# Patient Record
Sex: Female | Born: 1990 | Race: White | Hispanic: No | Marital: Married | Smoking: Never smoker
Health system: Southern US, Community
[De-identification: ages and names within clinical notes are randomized; demographics above are authoritative.]

## PROBLEM LIST (undated history)

## (undated) DIAGNOSIS — R87629 Unspecified abnormal cytological findings in specimens from vagina: Secondary | ICD-10-CM

## (undated) DIAGNOSIS — Z1589 Genetic susceptibility to other disease: Secondary | ICD-10-CM

## (undated) DIAGNOSIS — G2581 Restless legs syndrome: Secondary | ICD-10-CM

## (undated) DIAGNOSIS — O24419 Gestational diabetes mellitus in pregnancy, unspecified control: Secondary | ICD-10-CM

## (undated) DIAGNOSIS — E039 Hypothyroidism, unspecified: Secondary | ICD-10-CM

## (undated) DIAGNOSIS — O139 Gestational [pregnancy-induced] hypertension without significant proteinuria, unspecified trimester: Secondary | ICD-10-CM

## (undated) DIAGNOSIS — E7212 Methylenetetrahydrofolate reductase deficiency: Secondary | ICD-10-CM

## (undated) HISTORY — DX: Gestational diabetes mellitus in pregnancy, unspecified control: O24.419

## (undated) HISTORY — PX: APPENDECTOMY: SHX54

## (undated) HISTORY — DX: Methylenetetrahydrofolate reductase deficiency: E72.12

## (undated) HISTORY — DX: Unspecified abnormal cytological findings in specimens from vagina: R87.629

## (undated) HISTORY — DX: Gestational (pregnancy-induced) hypertension without significant proteinuria, unspecified trimester: O13.9

## (undated) HISTORY — DX: Hypothyroidism, unspecified: E03.9

## (undated) HISTORY — PX: WISDOM TOOTH EXTRACTION: SHX21

## (undated) HISTORY — DX: Genetic susceptibility to other disease: Z15.89

## (undated) HISTORY — DX: Restless legs syndrome: G25.81

---

## 2011-07-17 ENCOUNTER — Inpatient Hospital Stay (INDEPENDENT_AMBULATORY_CARE_PROVIDER_SITE_OTHER)
Admission: RE | Admit: 2011-07-17 | Discharge: 2011-07-17 | Disposition: A | Discharge status: Home / Self Care | Payer: 59 | Source: Ambulatory Visit | Attending: Family Medicine | Admitting: Family Medicine

## 2011-07-17 ENCOUNTER — Ambulatory Visit (INDEPENDENT_AMBULATORY_CARE_PROVIDER_SITE_OTHER): Payer: 59

## 2011-07-17 DIAGNOSIS — J45909 Unspecified asthma, uncomplicated: Secondary | ICD-10-CM

## 2011-09-29 ENCOUNTER — Emergency Department (HOSPITAL_COMMUNITY)
Admission: EM | Admit: 2011-09-29 | Discharge: 2011-09-29 | Disposition: A | Discharge status: Home / Self Care | Payer: 59 | Attending: Emergency Medicine | Admitting: Emergency Medicine

## 2011-09-29 DIAGNOSIS — R000 Tachycardia, unspecified: Secondary | ICD-10-CM | POA: Insufficient documentation

## 2011-09-29 DIAGNOSIS — T50904A Poisoning by unspecified drugs, medicaments and biological substances, undetermined, initial encounter: Secondary | ICD-10-CM | POA: Insufficient documentation

## 2011-09-29 DIAGNOSIS — T50901A Poisoning by unspecified drugs, medicaments and biological substances, accidental (unintentional), initial encounter: Secondary | ICD-10-CM | POA: Insufficient documentation

## 2011-09-29 LAB — ACETAMINOPHEN LEVEL: Acetaminophen (Tylenol), Serum: <15 ug/mL (ref 10–30)

## 2011-09-29 LAB — URINALYSIS, ROUTINE W REFLEX MICROSCOPIC
Bilirubin Urine: NEGATIVE
Hgb urine dipstick: NEGATIVE
Ketones, ur: NEGATIVE mg/dL
Nitrite: NEGATIVE
Protein, ur: NEGATIVE mg/dL
Urobilinogen, UA: 0.2 mg/dL (ref 0.0–1.0)

## 2011-09-29 LAB — DIFFERENTIAL
Basophils Absolute: 0 10*3/uL (ref 0.0–0.1)
Eosinophils Relative: 7 % — ABNORMAL HIGH (ref 0–5)
Lymphocytes Relative: 33 % (ref 12–46)
Neutro Abs: 3.8 10*3/uL (ref 1.7–7.7)

## 2011-09-29 LAB — COMPREHENSIVE METABOLIC PANEL
ALT: 18 U/L (ref 0–35)
AST: 17 U/L (ref 0–37)
Albumin: 4.4 g/dL (ref 3.5–5.2)
CO2: 24 mEq/L (ref 19–32)
Calcium: 10.2 mg/dL (ref 8.4–10.5)
GFR calc non Af Amer: >90 mL/min (ref >90)
Sodium: 138 mEq/L (ref 135–145)
Total Protein: 8.2 g/dL (ref 6.0–8.3)

## 2011-09-29 LAB — RAPID URINE DRUG SCREEN, HOSP PERFORMED
Amphetamines: NOT DETECTED
Barbiturates: NOT DETECTED
Benzodiazepines: NOT DETECTED
Cocaine: NOT DETECTED
Tetrahydrocannabinol: NOT DETECTED

## 2011-09-29 LAB — CBC
HCT: 39.9 % (ref 36.0–46.0)
RDW: 12.7 % (ref 11.5–15.5)
WBC: 7.2 10*3/uL (ref 4.0–10.5)

## 2011-09-29 LAB — POCT PREGNANCY, URINE: Preg Test, Ur: NEGATIVE

## 2011-11-01 ENCOUNTER — Encounter: Payer: Self-pay | Admitting: Emergency Medicine

## 2011-11-01 ENCOUNTER — Emergency Department (HOSPITAL_COMMUNITY)
Admission: EM | Admit: 2011-11-01 | Discharge: 2011-11-01 | Disposition: A | Discharge status: Home / Self Care | Payer: Self-pay | Source: Home / Self Care | Attending: Family Medicine | Admitting: Family Medicine

## 2011-11-01 DIAGNOSIS — J069 Acute upper respiratory infection, unspecified: Secondary | ICD-10-CM

## 2011-11-01 NOTE — ED Notes (Signed)
Sorethroat, lethargy, rash, no nausea, no vomiting, poor appetite

## 2011-11-06 NOTE — ED Provider Notes (Signed)
History     CSN: [id number] Arrival date & time: 11/01/2011  7:07 PM   First MD Initiated Contact with Patient 11/01/11 1842      Chief Complaint  Patient presents with  . Sore Throat    (Consider location/radiation/quality/duration/timing/severity/associated sxs/prior treatment) Patient is a 20 y.o. female presenting with URI. The history is provided by the patient.  URI The primary symptoms include fever, fatigue, sore throat, cough, myalgias and rash. The current episode started 3 to 5 days ago. This is a new problem.  Symptoms associated with the illness include congestion and rhinorrhea.    Past Medical History  Diagnosis Date  . Asthma     Past Surgical History  Procedure Date  . Appendectomy     History reviewed. No pertinent family history.  History  Substance Use Topics  . Smoking status: Never Smoker   . Smokeless tobacco: Not on file  . Alcohol Use: No    OB History    Grav Para Term Preterm Abortions TAB SAB Ect Mult Living                  Review of Systems  Constitutional: Positive for fever, appetite change and fatigue.  HENT: Positive for congestion, sore throat and rhinorrhea.   Eyes: Negative.   Respiratory: Positive for cough.   Cardiovascular: Negative.   Gastrointestinal: Negative.   Genitourinary: Negative.   Musculoskeletal: Positive for myalgias.  Skin: Positive for rash.    Allergies  Sulfa antibiotics  Home Medications   Current Outpatient Rx  Name Route Sig Dispense Refill  . DIPHENHYDRAMINE HCL 25 MG PO TABS Oral Take 25 mg by mouth every 6 (six) hours as needed.      . IBUPROFEN 200 MG PO CAPS Oral Take by mouth.        BP 132/78  Pulse 115  Temp(Src) 98.9 F (37.2 C) (Oral)  Resp 19  SpO2 95%  LMP 10/25/2011  Physical Exam  Constitutional: She is oriented to person, place, and time. She appears well-developed and well-nourished.  HENT:  Head: Normocephalic and atraumatic.  Right Ear: Tympanic membrane and  external ear normal.  Left Ear: Tympanic membrane and external ear normal.  Nose: Nose normal.  Mouth/Throat: Uvula is midline, oropharynx is clear and moist and mucous membranes are normal.  Eyes: EOM are normal. Pupils are equal, round, and reactive to light.  Neck: Normal range of motion.  Cardiovascular: Normal rate and regular rhythm.   Pulmonary/Chest: Effort normal and breath sounds normal.  Neurological: She is alert and oriented to person, place, and time.  Skin: Skin is warm and dry.    ED Course  Procedures (including critical care time)  Labs Reviewed - No data to display No results found.   1. URI (upper respiratory infection)       MDM          Richardo Priest, MD 11/06/11 2236

## 2012-03-02 ENCOUNTER — Other Ambulatory Visit (HOSPITAL_COMMUNITY)
Admission: RE | Admit: 2012-03-02 | Discharge: 2012-03-02 | Disposition: A | Discharge status: Home / Self Care | Payer: 59 | Source: Ambulatory Visit | Attending: Obstetrics and Gynecology | Admitting: Obstetrics and Gynecology

## 2012-03-02 DIAGNOSIS — Z01419 Encounter for gynecological examination (general) (routine) without abnormal findings: Secondary | ICD-10-CM | POA: Insufficient documentation

## 2012-10-10 IMAGING — CR DG CHEST 2V
2 series · 2 of 2 positions shown · non-contrast
Comparison: None.

CLINICAL DATA: Cough.

CHEST - 2 VIEW

[view not recorded (1 of 2)]
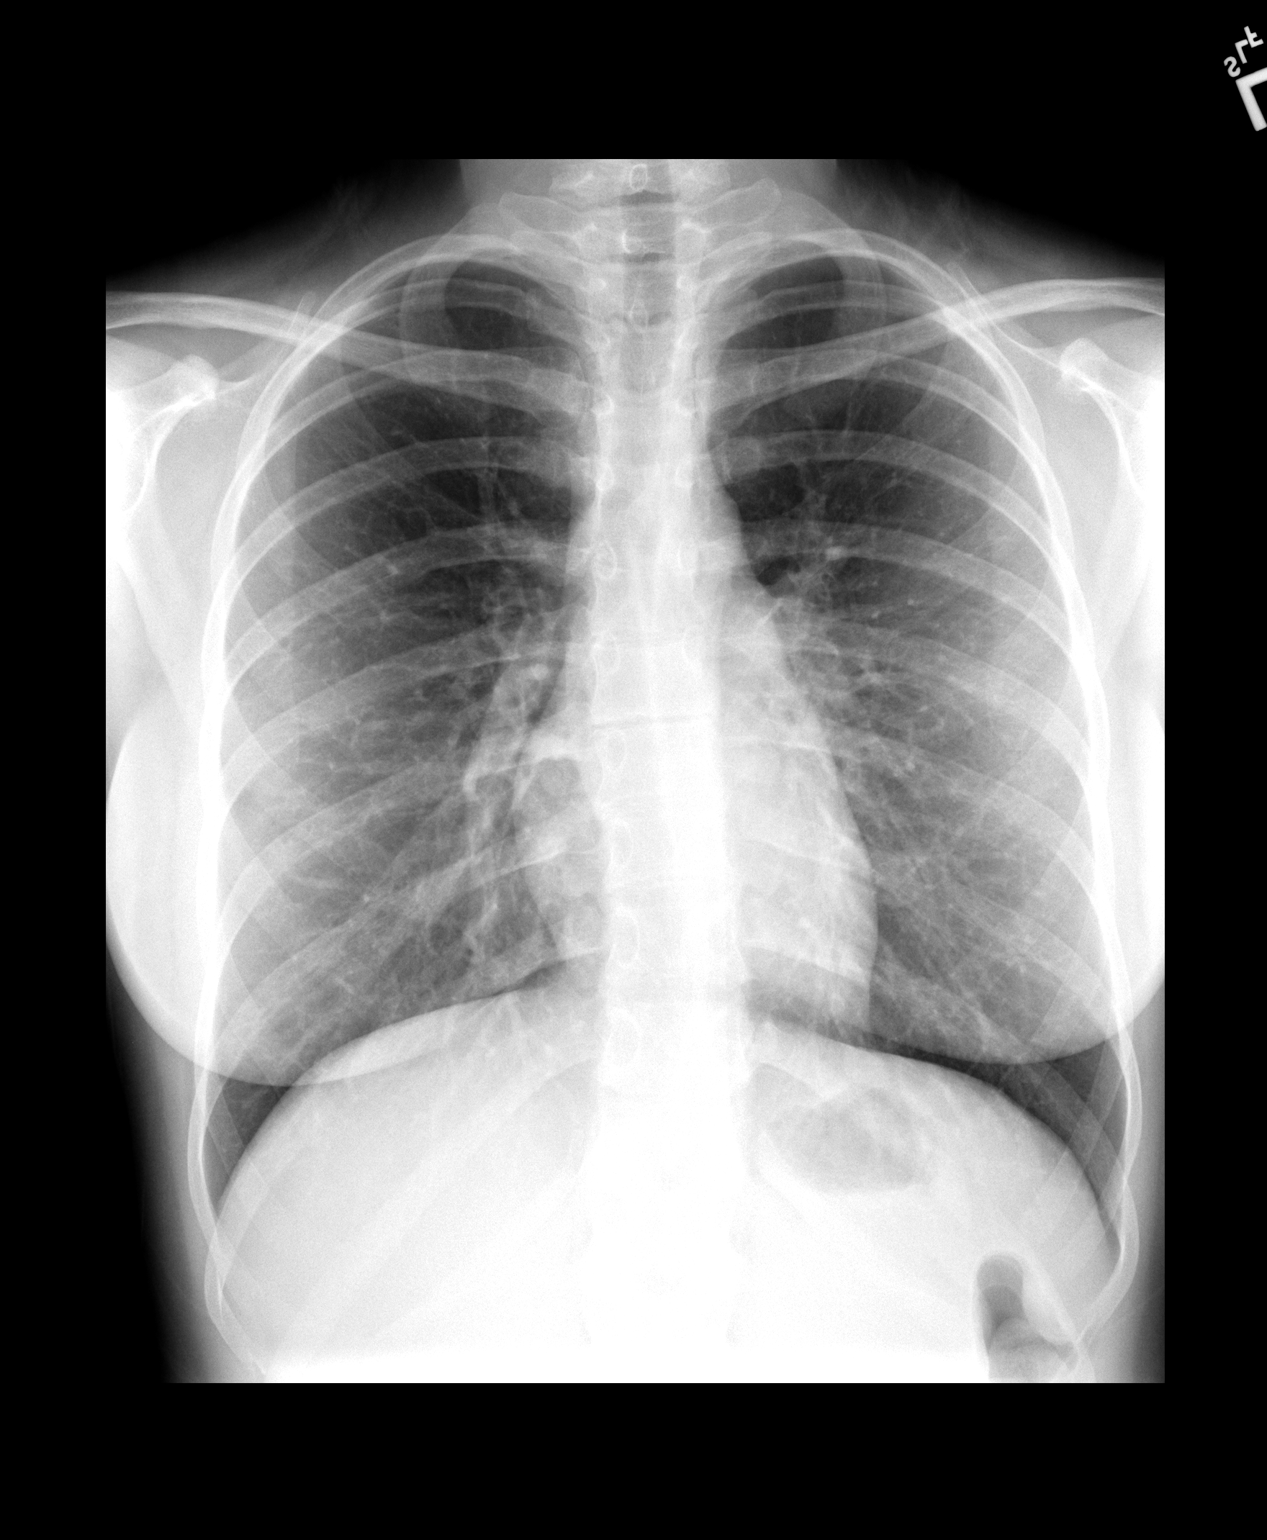

[view not recorded (2 of 2)]
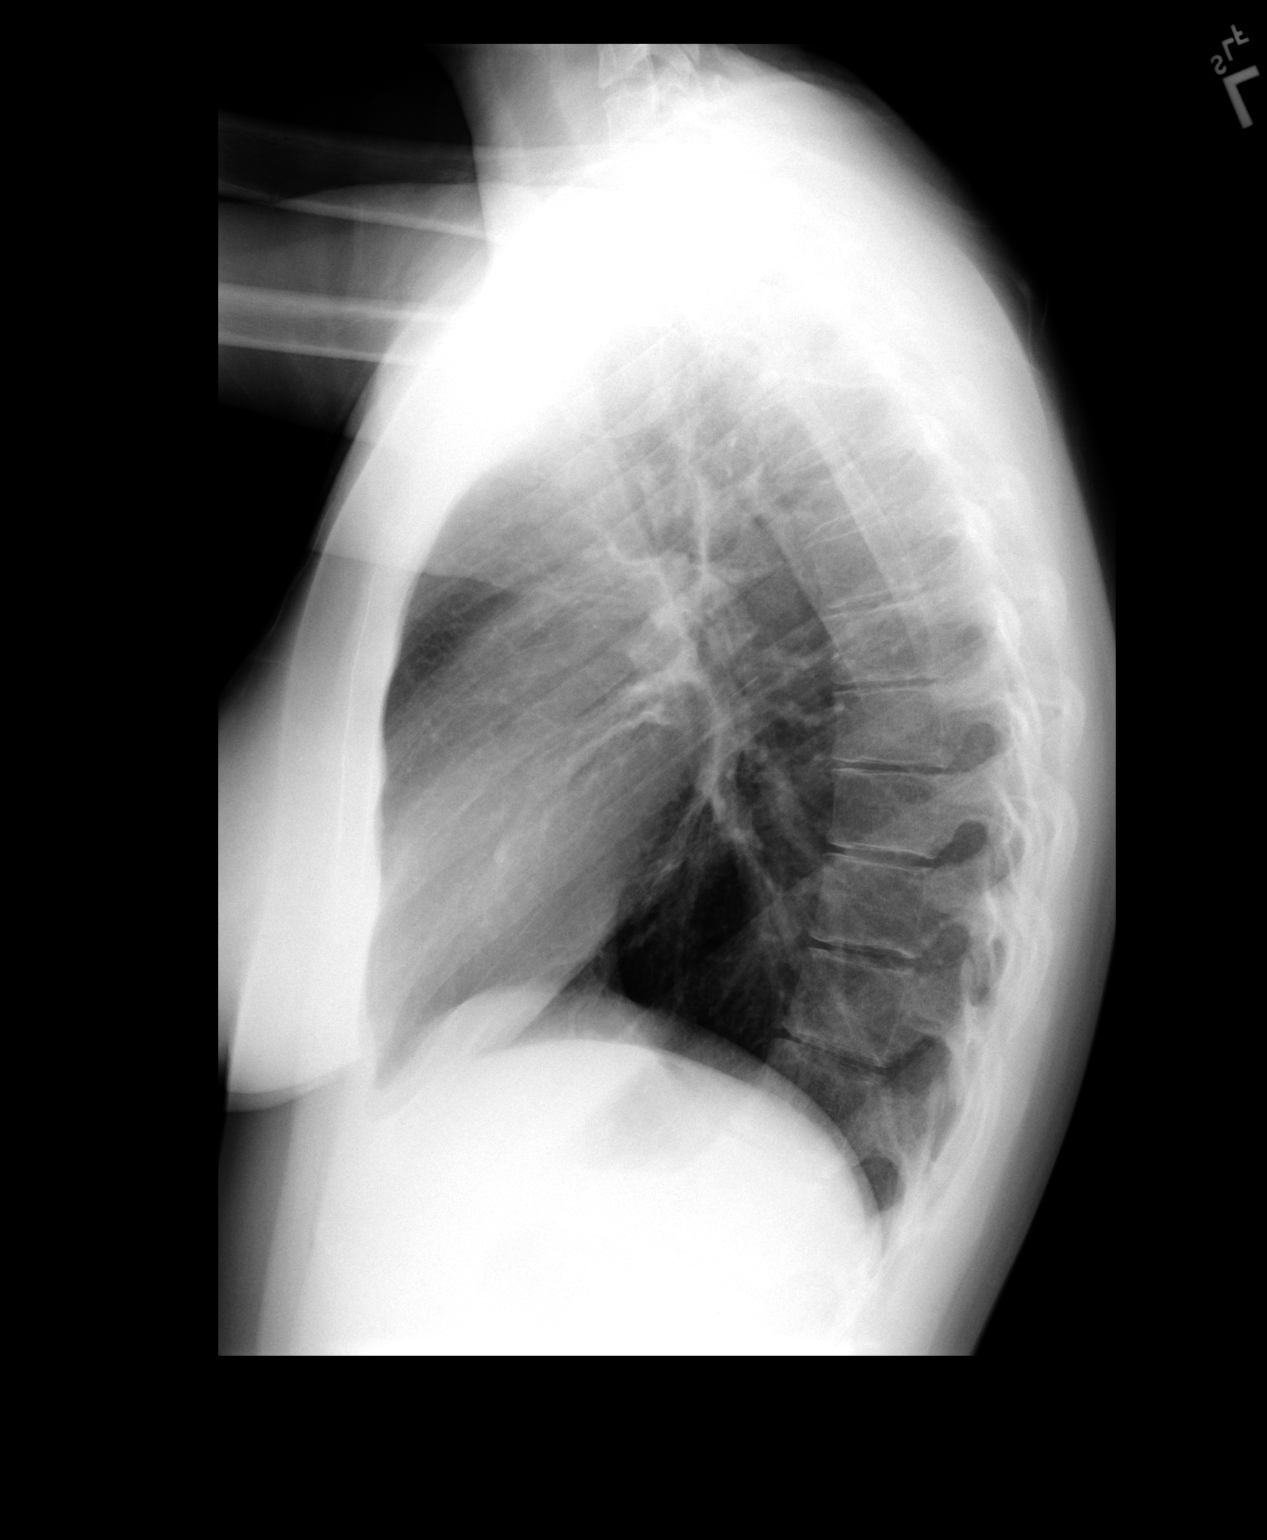

[2 of 2 positions shown; findings below may reference images not displayed]

FINDINGS: Heart and mediastinal contours are within normal limits.
No focal opacities or effusions.  No acute bony abnormality.
IMPRESSION: No active cardiopulmonary disease.

## 2014-03-02 ENCOUNTER — Other Ambulatory Visit (HOSPITAL_COMMUNITY)
Admission: RE | Admit: 2014-03-02 | Discharge: 2014-03-02 | Disposition: A | Discharge status: Home / Self Care | Payer: BC Managed Care – PPO | Source: Ambulatory Visit | Attending: Obstetrics and Gynecology | Admitting: Obstetrics and Gynecology

## 2014-03-02 ENCOUNTER — Other Ambulatory Visit: Payer: Self-pay | Admitting: Nurse Practitioner

## 2014-03-02 DIAGNOSIS — Z01419 Encounter for gynecological examination (general) (routine) without abnormal findings: Secondary | ICD-10-CM | POA: Insufficient documentation

## 2016-09-29 ENCOUNTER — Other Ambulatory Visit: Payer: Self-pay | Admitting: Adult Health

## 2016-10-01 ENCOUNTER — Encounter: Payer: Self-pay | Admitting: Adult Health

## 2016-10-01 ENCOUNTER — Other Ambulatory Visit (HOSPITAL_COMMUNITY)
Admission: RE | Admit: 2016-10-01 | Discharge: 2016-10-01 | Disposition: A | Discharge status: Home / Self Care | Payer: BC Managed Care – PPO | Source: Ambulatory Visit | Attending: Obstetrics & Gynecology | Admitting: Obstetrics & Gynecology

## 2016-10-01 ENCOUNTER — Ambulatory Visit (INDEPENDENT_AMBULATORY_CARE_PROVIDER_SITE_OTHER): Payer: BC Managed Care – PPO | Admitting: Adult Health

## 2016-10-01 VITALS — BP 116/60 | HR 90 | Ht 64.25 in | Wt 116.5 lb

## 2016-10-01 DIAGNOSIS — N898 Other specified noninflammatory disorders of vagina: Secondary | ICD-10-CM

## 2016-10-01 DIAGNOSIS — L298 Other pruritus: Secondary | ICD-10-CM

## 2016-10-01 DIAGNOSIS — Z01419 Encounter for gynecological examination (general) (routine) without abnormal findings: Secondary | ICD-10-CM | POA: Insufficient documentation

## 2016-10-01 LAB — POCT WET PREP (WET MOUNT)
Clue Cells Wet Prep Whiff POC: NEGATIVE
Trichomonas Wet Prep HPF POC: ABSENT
WBC WET PREP: NEGATIVE

## 2016-10-01 NOTE — Progress Notes (Signed)
Patient ID: Anna HandlerBethany Guzman, female   DOB: 09/26/1991, 25 y.o.   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 PCP is [facility].   Current Medications, Allergies, Past Medical History, Past Surgical History, Family History and Social History were reviewed in [name] electronic medical record.     Review of Systems: Patient denies any headaches, hearing loss, fatigue, blurred vision, shortness of breath, chest pain, abdominal pain, problems with bowel movements, urination, or intercourse. No joint pain or mood swings.    Physical Exam:BP 116/60 (BP Location: Left Arm, Patient Position: Sitting, Cuff Size: Normal)   Pulse 90   Ht 5' 4.25" (1.632 m)   Wt 116 lb 8 oz (52.8 kg)   LMP 09/08/2016 (Exact Date)   BMI 19.84 kg/m  General:  Well developed, well nourished, no acute distress Skin:  Warm and dry Neck:  Midline trachea, normal thyroid, good ROM, no lymphadenopathy Lungs; Clear to auscultation bilaterally Breast:  No dominant palpable mass, retraction, or nipple discharge Cardiovascular: Regular rate and rhythm Abdomen:  Soft, non tender, no hepatosplenomegaly Pelvic:  External genitalia is normal in appearance, no lesions.  The vagina is normal in appearance. Urethra has no lesions or masses. The cervix is nulliparous, pap performed.  Uterus is felt to be normal size, shape, and contour.  No adnexal masses or tenderness noted.Bladder is non tender, no masses felt.Wet prep was negative. Extremities/musculoskeletal:  No swelling or varicosities noted, no clubbing or cyanosis Psych:  No mood changes, alert and cooperative,seems happy PHQ 2 score 0.Discussed Taytulla and gave her handout from up to date to read and let me know, if she wants to try  it.  Impression:  1. Encounter for gynecological examination with Papanicolaou smear of cervix   2. Vaginal itching      Plan:  Physical in 1 year,pap in 3 if normal  [facility]

## 2016-10-01 NOTE — Patient Instructions (Signed)
Physical in 1 year,pap in 3 if normal 

## 2016-10-03 LAB — CYTOLOGY - PAP: DIAGNOSIS: NEGATIVE

## 2016-12-11 ENCOUNTER — Other Ambulatory Visit: Payer: Self-pay | Admitting: Adult Health

## 2016-12-15 ENCOUNTER — Telehealth: Payer: Self-pay | Admitting: Obstetrics & Gynecology

## 2016-12-15 ENCOUNTER — Telehealth: Payer: Self-pay | Admitting: Adult Health

## 2016-12-15 NOTE — Telephone Encounter (Signed)
Spoke with pt. Pt was wondering since she changed patch on Monday at 5 pm would she be on a schedule of continuing to change on Monday. I advised yes, to continue changing on Monday's. Pt voiced understanding. JSY

## 2016-12-15 NOTE — Telephone Encounter (Signed)
Pt called stating that she will need a refill of her medication, pt states that the pharmacy sent in a request on last week Thursday and was wanting to know the status. Please contact pt

## 2016-12-15 NOTE — Telephone Encounter (Signed)
Spoke with pt letting her know patch was refilled today. Pt was supposed to have put a new patch on yesterday am but didn't have any. I spoke with Selena BattenKim, CNM and she advised to put new patch on today and use back up for 2 weeks. Pt voiced understanding. JSY

## 2016-12-22 ENCOUNTER — Telehealth: Payer: Self-pay | Admitting: Adult Health

## 2016-12-22 NOTE — Telephone Encounter (Signed)
Spoke with pt. Pt wants to change birth control patch on Monday am instead of Monday at 5 pm. I advised that would be fine. Can use back up for 2 weeks just to be safe since changing the time of birth control. Pt voiced understanding. JSY

## 2017-03-17 ENCOUNTER — Encounter: Payer: Self-pay | Admitting: Adult Health

## 2017-05-25 ENCOUNTER — Other Ambulatory Visit: Payer: Self-pay | Admitting: Adult Health

## 2017-10-22 ENCOUNTER — Other Ambulatory Visit: Payer: Self-pay | Admitting: Adult Health

## 2017-11-18 ENCOUNTER — Other Ambulatory Visit: Payer: Self-pay | Admitting: Adult Health

## 2017-12-25 ENCOUNTER — Other Ambulatory Visit: Payer: BC Managed Care – PPO | Admitting: Adult Health

## 2017-12-28 ENCOUNTER — Ambulatory Visit (INDEPENDENT_AMBULATORY_CARE_PROVIDER_SITE_OTHER): Payer: BC Managed Care – PPO | Admitting: Adult Health

## 2017-12-28 ENCOUNTER — Encounter: Payer: Self-pay | Admitting: Adult Health

## 2017-12-28 VITALS — BP 126/76 | HR 89 | Resp 18 | Ht 64.25 in | Wt 119.0 lb

## 2017-12-28 DIAGNOSIS — Z3045 Encounter for surveillance of transdermal patch hormonal contraceptive device: Secondary | ICD-10-CM | POA: Insufficient documentation

## 2017-12-28 DIAGNOSIS — Z01419 Encounter for gynecological examination (general) (routine) without abnormal findings: Secondary | ICD-10-CM | POA: Diagnosis not present

## 2017-12-28 MED ORDER — NORELGESTROMIN-ETH ESTRADIOL 150-35 MCG/24HR TD PTWK: MEDICATED_PATCH | TRANSDERMAL | 4 refills | Status: DC

## 2017-12-28 NOTE — Progress Notes (Signed)
Patient ID: Anna HandlerBethany Guzman, female   DOB: 10/17/1991, 27 y.o.   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    Current Medications, Allergies, Past Medical History, Past Surgical History, Family History and Social History were reviewed in [name] electronic medical record.     Review of Systems: Patient denies any headaches, hearing loss, fatigue, blurred vision, shortness of breath, chest pain, abdominal pain, problems with bowel movements, urination, or intercourse. No joint pain or mood swings.   Physical Exam:BP 126/76 (BP Location: Left Arm, Patient Position: Sitting, Cuff Size: Normal)   Pulse 89   Resp 18   Ht 5' 4.25" (1.632 m)   Wt 119 lb (54 kg)   LMP 12/14/2017 (Exact Date)   BMI 20.27 kg/m  General:  Well developed, well nourished, no acute distress Skin:  Warm and dry Neck:  Midline trachea, normal thyroid, good ROM, no lymphadenopathy Lungs; Clear to auscultation bilaterally Breast:  No dominant palpable mass, retraction, or nipple discharge Cardiovascular: Regular rate and rhythm Abdomen:  Soft, non tender, no hepatosplenomegaly Pelvic:  External genitalia is normal in appearance, no lesions.  The vagina is normal in appearance,with brown blood. Urethra has no lesions or masses. The cervix is smooth.  Uterus is felt to be normal size, shape, and contour.  No adnexal masses or tenderness noted.Bladder is non tender, no masses felt. Extremities/musculoskeletal:  No swelling or varicosities noted, no clubbing or cyanosis Psych:  No mood changes, alert and cooperative,seems happy PHQ 2 score 0.She is thinking of trying to get pregnant sometime after the Fall.   Impression: 1. Encounter for well woman exam with routine gynecological exam   2. Encounter for surveillance of transdermal patch hormonal contraceptive  device       Plan:  Meds ordered this encounter  Medications  . norelgestromin-ethinyl estradiol (XULANE) 150-35 MCG/24HR transdermal patch    Sig: APPLY 1 PATCH PER WEEK FOR THREE WEEKS THEN OFF ONE WEEK AS DIRECTED    Dispense:  9 patch    Refill:  4    Order Specific Question:   Supervising Provider    Answer:   Lazaro ArmsEURE, LUTHER H [[id number]]  Physical and pap in 1 year Call if wants PNV called in,can use OTC with 800 mcg folic acid  Review handout on preparing for pregnancy

## 2017-12-28 NOTE — Patient Instructions (Signed)
Preparing for Pregnancy If you are considering becoming pregnant, make an appointment to see your regular health care provider to learn how to prepare for a safe and healthy pregnancy (preconception care). During a preconception care visit, your health care provider will:  Do a complete physical exam, including a Pap test.  Take a complete medical history.  Give you information, answer your questions, and help you resolve problems.  Preconception checklist Medical history  Tell your health care provider about any current or past medical conditions. Your pregnancy or your ability to become pregnant may be affected by chronic conditions, such as diabetes, chronic hypertension, and thyroid problems.  Include your family's medical history as well as your partner's medical history.  Tell your health care provider about any history of STIs (sexually transmitted infections).These can affect your pregnancy. In some cases, they can be passed to your baby. Discuss any concerns that you have about STIs.  If indicated, discuss the benefits of genetic testing. This testing will show whether there are any genetic conditions that may be passed from you or your partner to your baby.  Tell your health care provider about: ? Any problems you have had with conception or pregnancy. ? Any medicines you take. These include vitamins, herbal supplements, and over-the-counter medicines. ? Your history of immunizations. Discuss any vaccinations that you may need.  Diet  Ask your health care provider what to include in a healthy diet that has a balance of nutrients. This is especially important when you are pregnant or preparing to become pregnant.  Ask your health care provider to help you reach a healthy weight before pregnancy. ? If you are overweight, you may be at higher risk for certain complications, such as high blood pressure, diabetes, and preterm birth. ? If you are underweight, you are more likely  to have a baby who has a low birth weight.  Lifestyle, work, and home  Let your health care provider know: ? About any lifestyle habits that you have, such as alcohol use, drug use, or smoking. ? About recreational activities that may put you at risk during pregnancy, such as downhill skiing and certain exercise programs. ? Tell your health care provider about any international travel, especially any travel to places with an active Zika virus outbreak. ? About harmful substances that you may be exposed to at work or at home. These include chemicals, pesticides, radiation, or even litter boxes. ? If you do not feel safe at home.  Mental health  Tell your health care provider about: ? Any history of mental health conditions, including feelings of depression, sadness, or anxiety. ? Any medicines that you take for a mental health condition. These include herbs and supplements.  Home instructions to prepare for pregnancy Lifestyle  Eat a balanced diet. This includes fresh fruits and vegetables, whole grains, lean meats, low-fat dairy products, healthy fats, and foods that are high in fiber. Ask to meet with a nutritionist or registered dietitian for assistance with meal planning and goals.  Get regular exercise. Try to be active for at least 30 minutes a day on most days of the week. Ask your health care provider which activities are safe during pregnancy.  Do not use any products that contain nicotine or tobacco, such as cigarettes and e-cigarettes. If you need help quitting, ask your health care provider.  Do not drink alcohol.  Do not take illegal drugs.  Maintain a healthy weight. Ask your health care provider what weight range is   right for you.  General instructions  Keep an accurate record of your menstrual periods. This makes it easier for your health care provider to determine your baby's due date.  Begin taking prenatal vitamins and folic acid supplements daily as directed by  your health care provider.  Manage any chronic conditions, such as high blood pressure and diabetes, as told by your health care provider. This is important.  How do I know that I am pregnant? You may be pregnant if you have been sexually active and you miss your period. Symptoms of early pregnancy include:  Mild cramping.  Very light vaginal bleeding (spotting).  Feeling unusually tired.  Nausea and vomiting (morning sickness).  If you have any of these symptoms and you suspect that you might be pregnant, you can take a home pregnancy test. These tests check for a hormone in your urine (human chorionic gonadotropin, or hCG). A woman's body begins to make this hormone during early pregnancy. These tests are very accurate. Wait until at least the first day after you miss your period to take one. If the test shows that you are pregnant (you get a positive result), call your health care provider to make an appointment for prenatal care. What should I do if I become pregnant?  Make an appointment with your health care provider as soon as you suspect you are pregnant.  Do not use any products that contain nicotine, such as cigarettes, chewing tobacco, and e-cigarettes. If you need help quitting, ask your health care provider.  Do not drink alcoholic beverages. Alcohol is related to a number of birth defects.  Avoid toxic odors and chemicals.  You may continue to have sexual intercourse if it does not cause pain or other problems, such as vaginal bleeding. This information is not intended to replace advice given to you by your health care provider. Make sure you discuss any questions you have with your health care provider. Document Released: 11/06/2008 Document Revised: 07/22/2016 Document Reviewed: 06/15/2016 Elsevier Interactive Patient Education  2018 Elsevier Inc.  

## 2018-01-21 ENCOUNTER — Other Ambulatory Visit: Payer: Self-pay | Admitting: Adult Health

## 2018-03-02 ENCOUNTER — Encounter: Payer: Self-pay | Admitting: Adult Health

## 2018-03-25 ENCOUNTER — Encounter: Payer: Self-pay | Admitting: Adult Health

## 2018-03-26 ENCOUNTER — Encounter: Payer: Self-pay | Admitting: Adult Health

## 2018-03-29 ENCOUNTER — Other Ambulatory Visit: Payer: Self-pay | Admitting: Adult Health

## 2018-03-29 MED ORDER — PRENATAL PLUS 27-1 MG PO TABS: 1.0000 | ORAL_TABLET | Freq: Every day | ORAL | 12 refills | Status: DC

## 2018-03-29 NOTE — Progress Notes (Signed)
Rx PNV ?

## 2018-03-31 ENCOUNTER — Telehealth: Payer: Self-pay | Admitting: Obstetrics & Gynecology

## 2018-03-31 NOTE — Telephone Encounter (Signed)
Pt called asking if she needed to have blood work done before her appt on Friday. Advised pt that she did not; Advised that Dr Despina HiddenEure would order any blood work that he thought was necessary at her visit. Pt verbalized understanding.

## 2018-04-02 ENCOUNTER — Encounter: Payer: Self-pay | Admitting: Obstetrics & Gynecology

## 2018-04-02 ENCOUNTER — Ambulatory Visit: Payer: BC Managed Care – PPO | Admitting: Obstetrics & Gynecology

## 2018-04-02 VITALS — BP 140/76 | HR 102 | Ht 65.0 in | Wt 124.5 lb

## 2018-04-02 DIAGNOSIS — Z8349 Family history of other endocrine, nutritional and metabolic diseases: Secondary | ICD-10-CM

## 2018-04-02 NOTE — Progress Notes (Signed)
Chief Complaint  Patient presents with  . discuss MTHFR      27 y.o. G0P0000 Patient's last menstrual period was 03/31/2018. The current method of family planning is none.  Outpatient Encounter Medications as of 04/02/2018  Medication Sig  . ALBUTEROL IN Inhale into the lungs as needed.  . Cetirizine HCl (ZYRTEC PO) Take by mouth as needed.  . diphenhydrAMINE (BENADRYL) 25 MG tablet Take 25 mg by mouth every 6 (six) hours as needed.    . Fluticasone Propionate, Inhal, (FLOVENT IN) Inhale into the lungs 2 (two) times daily.  . Ibuprofen 200 MG CAPS Take 400 mg by mouth as needed.   . [DISCONTINUED] norelgestromin-ethinyl estradiol [employer]) 150-35 MCG/24HR transdermal patch APPLY 1 PATCH PER WEEK FOR THREE WEEKS THEN OFF ONE WEEK AS DIRECTED  . prenatal vitamin w/FE, FA (PRENATAL 1 + 1) 27-1 MG TABS tablet Take 1 tablet by mouth daily at 12 noon.  . [DISCONTINUED] [employer] 150-35 MCG/24HR transdermal patch APPLY 1 PATCH PER WEEK FOR THREE WEEKS THEN OFF ONE WEEK AS DIRECTED   No facility-administered encounter medications on file as of 04/02/2018.     Subjective Sister was told had MTHFR wanted to evaluate for herself Past Medical History:  Diagnosis Date  . Asthma   . MTHFR gene mutation (HCC)   . Restless leg syndrome     Past Surgical History:  Procedure Laterality Date  . APPENDECTOMY    . WISDOM TOOTH EXTRACTION      OB History    Gravida  0   Para  0   Term  0   Preterm  0   AB  0   Living  0     SAB  0   TAB  0   Ectopic  0   Multiple  0   Live Births  0           Allergies  Allergen Reactions  . Sulfa Antibiotics     Social History   Socioeconomic History  . Marital status: Single    Spouse name: Not on file  . Number of children: Not on file  . Years of education: Not on file  . Highest education level: Not on file  Occupational History  . Not on file  Social Needs  . Financial resource strain: Not on file  . Food  insecurity:    Worry: Not on file    Inability: Not on file  . Transportation needs:    Medical: Not on file    Non-medical: Not on file  Tobacco Use  . Smoking status: Never Smoker  . Smokeless tobacco: Never Used  Substance and Sexual Activity  . Alcohol use: Yes    Comment: very rarely  . Drug use: No  . Sexual activity: Yes    Birth control/protection: Patch  Lifestyle  . Physical activity:    Days per week: Not on file    Minutes per session: Not on file  . Stress: Not on file  Relationships  . Social connections:    Talks on phone: Not on file    Gets together: Not on file    Attends religious service: Not on file    Active member of club or organization: Not on file    Attends meetings of clubs or organizations: Not on file    Relationship status: Not on file  Other Topics Concern  . Not on file  Social History Narrative  . Not on file  Family History  Problem Relation Age of Onset  . Alzheimer's disease Paternal Grandfather   . Hyperlipidemia Father   . Fibromyalgia Mother   . Osteoporosis Mother   . Other Brother        has hearing aids    Medications:       Current Outpatient Medications:  .  ALBUTEROL IN, Inhale into the lungs as needed., Disp: , Rfl:  .  Cetirizine HCl (ZYRTEC PO), Take by mouth as needed., Disp: , Rfl:  .  diphenhydrAMINE (BENADRYL) 25 MG tablet, Take 25 mg by mouth every 6 (six) hours as needed.  , Disp: , Rfl:  .  Fluticasone Propionate, Inhal, (FLOVENT IN), Inhale into the lungs 2 (two) times daily., Disp: , Rfl:  .  Ibuprofen 200 MG CAPS, Take 400 mg by mouth as needed. , Disp: , Rfl:  .  prenatal vitamin w/FE, FA (PRENATAL 1 + 1) 27-1 MG TABS tablet, Take 1 tablet by mouth daily at 12 noon., Disp: 30 each, Rfl: 12 .  XULANE 150-35 MCG/24HR transdermal patch, APPLY 1 PATCH PER WEEK FOR THREE WEEKS THEN OFF ONE WEEK AS DIRECTED, Disp: 9 patch, Rfl: 0  Objective Blood pressure 140/76, pulse (!) 102, height 5\' 5"  (1.651 m),  weight 124 lb 8 oz (56.5 kg), last menstrual period 03/31/2018.    Pertinent ROS   Labs or studies     Impression Diagnoses this Encounter::   ICD-10-CM   1. Family history of MTHFR deficiency Z83.49 Homocysteine    Established relevant diagnosis(es):   Plan/Recommendations: No orders of the defined types were placed in this encounter.   Labs or Scans Ordered: Orders Placed This Encounter  Procedures  . Homocysteine    Management:: Check homocysteine level  Follow up Return if symptoms worsen or fail to improve.        Face to face time:  10 minutes  Greater than 50% of the visit time was spent in counseling and coordination of care with the patient.  The summary and outline of the counseling and care coordination is summarized in the note above.   All questions were answered.

## 2018-04-05 LAB — HOMOCYSTEINE: Homocysteine: 10.4 umol/L (ref 0.0–15.0)

## 2018-05-23 ENCOUNTER — Other Ambulatory Visit: Payer: Self-pay | Admitting: Adult Health

## 2018-10-04 ENCOUNTER — Ambulatory Visit: Payer: BC Managed Care – PPO | Admitting: Women's Health

## 2018-10-06 ENCOUNTER — Encounter: Payer: Self-pay | Admitting: Adult Health

## 2018-10-06 ENCOUNTER — Ambulatory Visit: Payer: BC Managed Care – PPO | Admitting: Adult Health

## 2018-10-06 VITALS — BP 125/83 | HR 88 | Ht 65.0 in | Wt 120.0 lb

## 2018-10-06 DIAGNOSIS — Z3202 Encounter for pregnancy test, result negative: Secondary | ICD-10-CM

## 2018-10-06 DIAGNOSIS — Z319 Encounter for procreative management, unspecified: Secondary | ICD-10-CM | POA: Diagnosis not present

## 2018-10-06 DIAGNOSIS — N911 Secondary amenorrhea: Secondary | ICD-10-CM | POA: Diagnosis not present

## 2018-10-06 LAB — POCT URINE PREGNANCY: Preg Test, Ur: NEGATIVE

## 2018-10-06 MED ORDER — MEDROXYPROGESTERONE ACETATE 10 MG PO TABS: 10.0000 mg | ORAL_TABLET | Freq: Every day | ORAL | 0 refills | Status: DC

## 2018-10-06 NOTE — Progress Notes (Signed)
  Subjective:     Patient ID: Anna Guzman, female   DOB: 07/03/1991, 27 y.o.   MRN: [id number]  HPI Anna Guzman is a 27 year old white female, married and stopped Xulane 7/15, had periods July 18-22 and none since.And wants to be pregnant.   Review of Systems No period in 12+ weeks  Wants to be pregnant Reviewed past medical,surgical, social and family history. Reviewed medications and allergies.     Objective:   Physical Exam BP 125/83 (BP Location: Left Arm, Patient Position: Sitting, Cuff Size: Normal)   Pulse 88   Ht 5\' 5"  (1.651 m)   Wt 120 lb (54.4 kg)   LMP 06/24/2018   BMI 19.97 kg/m   UPT negative. PHQ 9 score 0. Talk only, will rx provera 10 mg x 14 days to see if can get withdrawal bleed     Assessment:     1. Amenorrhea, secondary   2. Pregnancy test negative   3. Patient desires pregnancy       Plan:     Meds ordered this encounter  Medications  . medroxyPROGESTERone (PROVERA) 10 MG tablet    Sig: Take 1 tablet (10 mg total) by mouth daily.    Dispense:  14 tablet    Refill:  0    Order Specific Question:   Supervising Provider    Answer:   Lazaro Arms [[id number]]  Call with bleeding and will check progesterone level day 21, or if no bleeding call me and will check labs TSH,PRL,LH/estradiol

## 2018-10-15 ENCOUNTER — Telehealth: Payer: Self-pay | Admitting: Adult Health

## 2018-10-15 NOTE — Telephone Encounter (Signed)
Pt called stating that Anna Guzman told her to give her a call when she almost done taking a certain pill. Pt would like a call back from [place]

## 2018-10-25 ENCOUNTER — Other Ambulatory Visit: Payer: Self-pay | Admitting: Women's Health

## 2018-10-25 DIAGNOSIS — Z319 Encounter for procreative management, unspecified: Secondary | ICD-10-CM

## 2018-11-05 ENCOUNTER — Other Ambulatory Visit: Payer: Self-pay

## 2018-11-06 ENCOUNTER — Telehealth: Payer: BC Managed Care – PPO | Admitting: Family

## 2018-11-06 DIAGNOSIS — J019 Acute sinusitis, unspecified: Secondary | ICD-10-CM

## 2018-11-06 DIAGNOSIS — B9789 Other viral agents as the cause of diseases classified elsewhere: Secondary | ICD-10-CM

## 2018-11-06 MED ORDER — FLUTICASONE PROPIONATE 50 MCG/ACT NA SUSP: 2.0000 | Freq: Every day | NASAL | 0 refills | Status: DC

## 2018-11-06 NOTE — Progress Notes (Signed)
We are sorry that you are not feeling well.  Here is how we plan to help!  Based on what you have shared with me it looks like you have sinusitis.  Sinusitis is inflammation and infection in the sinus cavities of the head.  Based on your presentation I believe you most likely have Acute Viral Sinusitis.This is an infection most likely caused by a virus. There is not specific treatment for viral sinusitis other than to help you with the symptoms until the infection runs its course.  You may use an oral decongestant such as Mucinex D or if you have glaucoma or high blood pressure use plain Mucinex. Saline nasal spray help and can safely be used as often as needed for congestion, I have prescribed: Fluticasone nasal spray two sprays in each nostril once a day A Some authorities believe that zinc sprays or the use of Echinacea may shorten the course of your symptoms.  Sinus infections are not as easily transmitted as other respiratory infection, however we still recommend that you avoid close contact with loved ones, especially the very young and elderly.  Remember to wash your hands thoroughly throughout the day as this is the number one way to prevent the spread of infection!  Home Care:  Only take medications as instructed by your medical team.  Do not take these medications with alcohol.  A steam or ultrasonic humidifier can help congestion.  You can place a towel over your head and breathe in the steam from hot water coming from a faucet.  Avoid close contacts especially the very young and the elderly.  Cover your mouth when you cough or sneeze.  Always remember to wash your hands.  Get Help Right Away If:  You develop worsening fever or sinus pain.  You develop a severe head ache or visual changes.  Your symptoms persist after you have completed your treatment plan.  Make sure you  Understand these instructions.  Will watch your condition.  Will get help right away if you are  not doing well or get worse.  Your e-visit answers were reviewed by a board certified advanced clinical practitioner to complete your personal care plan.  Depending on the condition, your plan could have included both over the counter or prescription medications.  If there is a problem please reply  once you have received a response from your provider.  Your safety is important to us.  If you have drug allergies check your prescription carefully.    You can use MyChart to ask questions about today's visit, request a non-urgent call back, or ask for a work or school excuse for 24 hours related to this e-Visit. If it has been greater than 24 hours you will need to follow up with your provider, or enter a new e-Visit to address those concerns. You will get an e-mail in the next two days asking about your experience.  I hope that your e-visit has been valuable and will speed your recovery. Thank you for using e-visits.

## 2018-11-10 LAB — PROGESTERONE: PROGESTERONE: 0.1 ng/mL

## 2019-01-13 ENCOUNTER — Ambulatory Visit: Payer: BC Managed Care – PPO | Admitting: Adult Health

## 2019-01-20 ENCOUNTER — Ambulatory Visit: Payer: BC Managed Care – PPO | Admitting: Adult Health

## 2019-01-20 ENCOUNTER — Encounter: Payer: Self-pay | Admitting: Adult Health

## 2019-01-20 VITALS — BP 141/86 | HR 104 | Ht 65.0 in | Wt 126.0 lb

## 2019-01-20 DIAGNOSIS — N911 Secondary amenorrhea: Secondary | ICD-10-CM | POA: Diagnosis not present

## 2019-01-20 DIAGNOSIS — Z319 Encounter for procreative management, unspecified: Secondary | ICD-10-CM | POA: Diagnosis not present

## 2019-01-20 DIAGNOSIS — Z8349 Family history of other endocrine, nutritional and metabolic diseases: Secondary | ICD-10-CM | POA: Diagnosis not present

## 2019-01-20 NOTE — Patient Instructions (Signed)
Preparing for Pregnancy  If you are considering becoming pregnant, make an appointment to see your regular health care provider to learn how to prepare for a safe and healthy pregnancy (preconception care). During a preconception care visit, your health care provider will:  · Do a complete physical exam, including a Pap test.  · Take a complete medical history.  · Give you information, answer your questions, and help you resolve problems.  Preconception checklist  Medical history  · Tell your health care provider about any current or past medical conditions. Your pregnancy or your ability to become pregnant may be affected by chronic conditions, such as diabetes, chronic hypertension, and thyroid problems.  · Include your family's medical history as well as your partner's medical history.  · Tell your health care provider about any history of STIs (sexually transmitted infections). These can affect your pregnancy. In some cases, they can be passed to your baby. Discuss any concerns that you have about STIs.  · If indicated, discuss the benefits of genetic testing. This testing will show whether there are any genetic conditions that may be passed from you or your partner to your baby.  · Tell your health care provider about:  ? Any problems you have had with conception or pregnancy.  ? Any medicines you take. These include vitamins, herbal supplements, and over-the-counter medicines.  ? Your history of immunizations. Discuss any vaccinations that you may need.  Diet  · Ask your health care provider what to include in a healthy diet that has a balance of nutrients. This is especially important when you are pregnant or preparing to become pregnant.  · Ask your health care provider to help you reach a healthy weight before pregnancy.  ? If you are overweight, you may be at higher risk for certain complications, such as high blood pressure, diabetes, and preterm birth.  ? If you are underweight, you are more likely to  have a baby who has a low birth weight.  Lifestyle, work, and home  · Let your health care provider know:  ? About any lifestyle habits that you have, such as alcohol use, drug use, or smoking.  ? About recreational activities that may put you at risk during pregnancy, such as downhill skiing and certain exercise programs.  ? Tell your health care provider about any international travel, especially any travel to places with an active Zika virus outbreak.  ? About harmful substances that you may be exposed to at work or at home. These include chemicals, pesticides, radiation, or even litter boxes.  ? If you do not feel safe at home.  Mental health  · Tell your health care provider about:  ? Any history of mental health conditions, including feelings of depression, sadness, or anxiety.  ? Any medicines that you take for a mental health condition. These include herbs and supplements.  Home instructions to prepare for pregnancy  Lifestyle    · Eat a balanced diet. This includes fresh fruits and vegetables, whole grains, lean meats, low-fat dairy products, healthy fats, and foods that are high in fiber. Ask to meet with a nutritionist or registered dietitian for assistance with meal planning and goals.  · Get regular exercise. Try to be active for at least 30 minutes a day on most days of the week. Ask your health care provider which activities are safe during pregnancy.  · Do not use any products that contain nicotine or tobacco, such as cigarettes and e-cigarettes. If you need   help quitting, ask your health care provider.  · Do not drink alcohol.  · Do not take illegal drugs.  · Maintain a healthy weight. Ask your health care provider what weight range is right for you.  General instructions  · Keep an accurate record of your menstrual periods. This makes it easier for your health care provider to determine your baby's due date.  · Begin taking prenatal vitamins and folic acid supplements daily as directed by your  health care provider.  · Manage any chronic conditions, such as high blood pressure and diabetes, as told by your health care provider. This is important.  How do I know that I am pregnant?  You may be pregnant if you have been sexually active and you miss your period. Symptoms of early pregnancy include:  · Mild cramping.  · Very light vaginal bleeding (spotting).  · Feeling unusually tired.  · Nausea and vomiting (morning sickness).  If you have any of these symptoms and you suspect that you might be pregnant, you can take a home pregnancy test. These tests check for a hormone in your urine (human chorionic gonadotropin, or hCG). A woman's body begins to make this hormone during early pregnancy. These tests are very accurate. Wait until at least the first day after you miss your period to take one. If the test shows that you are pregnant (you get a positive result), call your health care provider to make an appointment for prenatal care.  What should I do if I become pregnant?         · Make an appointment with your health care provider as soon as you suspect you are pregnant.  · Do not use any products that contain nicotine, such as cigarettes, chewing tobacco, and e-cigarettes. If you need help quitting, ask your health care provider.  · Do not drink alcoholic beverages. Alcohol is related to a number of birth defects.  · Avoid toxic odors and chemicals.  · You may continue to have sexual intercourse if it does not cause pain or other problems, such as vaginal bleeding.  This information is not intended to replace advice given to you by your health care provider. Make sure you discuss any questions you have with your health care provider.  Document Released: 11/06/2008 Document Revised: 11/26/2017 Document Reviewed: 06/15/2016  Elsevier Interactive Patient Education © 2019 [employer]

## 2019-01-20 NOTE — Progress Notes (Signed)
Patient ID: Anna HandlerBethany Guzman, female   DOB: 01/21/1991, 28 y.o.   MRN: [id number] History of Present Illness: Anna CopierBethany is a 28 year old white female, married, she is a Runner, broadcasting/film/videoteacher and desires pregnancy, but no period since November after taking provera.  Husband is with her today.   Current Medications, Allergies, Past Medical History, Past Surgical History, Family History and Social History [facility].     Review of Systems: No period since November after taking provera, PMP July   Physical Exam:BP (!) 141/86 (BP Location: Left Arm, Patient Position: Sitting, Cuff Size: Normal)   Pulse (!) 104   Ht 5\' 5"  (1.651 m)   Wt 126 lb (57.2 kg)   BMI 20.97 kg/m  General:  Well developed, well nourished, no acute distress Skin:  Warm and dry Psych:  No mood changes, alert and cooperative,seems happy Fall risk is low.   Discussed checking labs today and then starting clomid or femara. May need to check semen analysis, in future. Face time 15 minutes, counseling.  She had normal homocysteine level in April 2019. Progesterone level was 0.1 in December 2019.   Impression: 1. Amenorrhea, secondary   2. Desire for pregnancy   3. Family history of MTHFR deficiency       Plan: Check TSH,prolactin and LH and estradiol Review handout on preparing for pregnancy  Research clomid vs femara  Will talk when results back Husband to wear boxers and increase vitamin D

## 2019-01-21 ENCOUNTER — Telehealth: Payer: Self-pay | Admitting: Adult Health

## 2019-01-21 LAB — TSH: TSH: 4.82 u[IU]/mL — ABNORMAL HIGH (ref 0.450–4.500)

## 2019-01-21 LAB — LUTEINIZING HORMONE: LH: 26.3 m[IU]/mL

## 2019-01-21 LAB — PROLACTIN: Prolactin: 12.7 ng/mL (ref 4.8–23.3)

## 2019-01-21 LAB — ESTRADIOL: Estradiol: 52.1 pg/mL

## 2019-01-21 MED ORDER — LEVOTHYROXINE SODIUM 25 MCG PO TABS: 25.0000 ug | ORAL_TABLET | Freq: Every day | ORAL | 3 refills | Status: DC

## 2019-01-21 NOTE — Telephone Encounter (Signed)
Pt aware of labs and that synthroid sent to [facility], recheck labs in 4 weeks

## 2019-01-21 NOTE — Telephone Encounter (Signed)
Left message that Prolactin, LH and estradiol looked good but TSH (thyroid) was elevated at 4.820 and I will send rx [facility] and will recheck TSH in about 4 weeks, call me if any questions

## 2019-02-14 ENCOUNTER — Other Ambulatory Visit: Payer: Self-pay | Admitting: Adult Health

## 2019-02-14 DIAGNOSIS — E039 Hypothyroidism, unspecified: Secondary | ICD-10-CM

## 2019-02-14 NOTE — Progress Notes (Signed)
Check TSH 

## 2019-02-15 LAB — TSH: TSH: 3.55 u[IU]/mL (ref 0.450–4.500)

## 2019-02-16 ENCOUNTER — Other Ambulatory Visit: Payer: Self-pay | Admitting: Adult Health

## 2019-02-16 MED ORDER — LEVOTHYROXINE SODIUM 50 MCG PO TABS: 50.0000 ug | ORAL_TABLET | Freq: Every day | ORAL | 6 refills | Status: DC

## 2019-02-16 NOTE — Progress Notes (Signed)
Increased levothyroxine to 50 mcg

## 2019-03-23 ENCOUNTER — Other Ambulatory Visit: Payer: Self-pay | Admitting: Adult Health

## 2019-03-23 DIAGNOSIS — E039 Hypothyroidism, unspecified: Secondary | ICD-10-CM

## 2019-03-23 NOTE — Progress Notes (Signed)
Check TSH 

## 2019-03-25 ENCOUNTER — Other Ambulatory Visit: Payer: Self-pay | Admitting: Adult Health

## 2019-03-25 LAB — TSH: TSH: 1.05 u[IU]/mL (ref 0.450–4.500)

## 2019-03-25 MED ORDER — CLOMIPHENE CITRATE 50 MG PO TABS: ORAL_TABLET | ORAL | 2 refills | Status: DC

## 2019-03-25 NOTE — Progress Notes (Signed)
rx clomid at [facility]

## 2019-04-18 ENCOUNTER — Other Ambulatory Visit: Payer: Self-pay

## 2019-04-18 ENCOUNTER — Other Ambulatory Visit: Payer: BC Managed Care – PPO

## 2019-04-18 DIAGNOSIS — Z319 Encounter for procreative management, unspecified: Secondary | ICD-10-CM

## 2019-04-19 LAB — PROGESTERONE: Progesterone: 0.3 ng/mL

## 2019-05-11 ENCOUNTER — Other Ambulatory Visit: Payer: Self-pay | Admitting: Adult Health

## 2019-05-11 DIAGNOSIS — Z319 Encounter for procreative management, unspecified: Secondary | ICD-10-CM

## 2019-05-11 NOTE — Progress Notes (Signed)
Ck progesterone 6/5

## 2019-05-14 LAB — PROGESTERONE: Progesterone: 7.1 ng/mL

## 2019-06-02 ENCOUNTER — Other Ambulatory Visit: Payer: Self-pay | Admitting: Adult Health

## 2019-06-06 ENCOUNTER — Other Ambulatory Visit: Payer: Self-pay | Admitting: Adult Health

## 2019-06-06 DIAGNOSIS — Z319 Encounter for procreative management, unspecified: Secondary | ICD-10-CM

## 2019-06-06 NOTE — Progress Notes (Signed)
Ck progesterone 7/6

## 2019-06-07 ENCOUNTER — Other Ambulatory Visit: Payer: Self-pay | Admitting: Adult Health

## 2019-06-07 MED ORDER — TRINATE PO TABS: 1.0000 | ORAL_TABLET | Freq: Every day | ORAL | 12 refills | Status: DC

## 2019-06-07 NOTE — Progress Notes (Signed)
Fixed OCs to 1 daily

## 2019-06-13 ENCOUNTER — Other Ambulatory Visit: Payer: Self-pay | Admitting: Adult Health

## 2019-06-14 ENCOUNTER — Other Ambulatory Visit: Payer: Self-pay | Admitting: Adult Health

## 2019-06-14 LAB — PROGESTERONE: Progesterone: 0.3 ng/mL

## 2019-06-14 MED ORDER — CLOMIPHENE CITRATE 50 MG PO TABS: ORAL_TABLET | ORAL | 2 refills | Status: DC

## 2019-06-14 NOTE — Progress Notes (Signed)
Increase clomid to 100 mg  

## 2019-07-12 ENCOUNTER — Other Ambulatory Visit: Payer: Self-pay | Admitting: Adult Health

## 2019-07-12 DIAGNOSIS — Z319 Encounter for procreative management, unspecified: Secondary | ICD-10-CM

## 2019-07-12 NOTE — Progress Notes (Signed)
Ck progesterone 8/17

## 2019-07-26 ENCOUNTER — Other Ambulatory Visit: Payer: Self-pay | Admitting: Women's Health

## 2019-07-26 ENCOUNTER — Other Ambulatory Visit: Payer: Self-pay | Admitting: Adult Health

## 2019-07-26 DIAGNOSIS — N979 Female infertility, unspecified: Secondary | ICD-10-CM

## 2019-07-26 LAB — PROGESTERONE: Progesterone: 0.4 ng/mL

## 2019-08-16 ENCOUNTER — Encounter: Payer: Self-pay | Admitting: *Deleted

## 2019-08-17 LAB — PROGESTERONE: Progesterone: 10 ng/mL

## 2019-08-23 ENCOUNTER — Other Ambulatory Visit: Payer: Self-pay | Admitting: Women's Health

## 2019-08-23 DIAGNOSIS — N979 Female infertility, unspecified: Secondary | ICD-10-CM

## 2019-09-10 LAB — PROGESTERONE: Progesterone: 0.2 ng/mL

## 2019-09-13 ENCOUNTER — Other Ambulatory Visit: Payer: Self-pay | Admitting: Adult Health

## 2019-09-13 MED ORDER — CLOMIPHENE CITRATE 50 MG PO TABS: ORAL_TABLET | ORAL | 2 refills | Status: DC

## 2019-09-13 NOTE — Progress Notes (Signed)
Will increase clomid to 150 mg  

## 2019-09-14 ENCOUNTER — Other Ambulatory Visit: Payer: Self-pay | Admitting: Adult Health

## 2019-09-14 DIAGNOSIS — Z319 Encounter for procreative management, unspecified: Secondary | ICD-10-CM

## 2019-09-14 NOTE — Progress Notes (Signed)
Ck progestone level 10/27

## 2019-10-10 ENCOUNTER — Other Ambulatory Visit: Payer: Self-pay | Admitting: Adult Health

## 2019-10-10 DIAGNOSIS — Z319 Encounter for procreative management, unspecified: Secondary | ICD-10-CM

## 2019-10-10 NOTE — Progress Notes (Signed)
Ck progesterone 11/19

## 2019-10-28 LAB — PROGESTERONE: Progesterone: 0.2 ng/mL

## 2019-11-10 ENCOUNTER — Telehealth: Payer: Self-pay | Admitting: *Deleted

## 2019-11-10 NOTE — Telephone Encounter (Signed)
Patient called stating has appointment to discuss fertility options on 12/7. Just found out husband can not be at this appointment and is very upset because wants him at this appointment. She has been trying for a very long time to get pregnant and needs his support and wants him involved in this appointment. Wants to know if this could be a virtual visit so he can be present. Telephoned patient and advised sent message to Dr. Elonda Husky about virtual visit. Patient voiced understanding.

## 2019-11-11 NOTE — Telephone Encounter (Signed)
Telephoned patient at home number and advised patient that Dr. Elonda Husky said virtual visit would be fine for December 7. Patient voiced understanding.

## 2019-11-11 NOTE — Telephone Encounter (Signed)
Sure we can give that a go

## 2019-11-14 ENCOUNTER — Encounter: Payer: Self-pay | Admitting: Obstetrics & Gynecology

## 2019-11-14 ENCOUNTER — Telehealth (INDEPENDENT_AMBULATORY_CARE_PROVIDER_SITE_OTHER): Payer: BC Managed Care – PPO | Admitting: Obstetrics & Gynecology

## 2019-11-14 VITALS — Ht 65.0 in | Wt 124.0 lb

## 2019-11-14 DIAGNOSIS — N970 Female infertility associated with anovulation: Secondary | ICD-10-CM

## 2019-11-14 NOTE — Progress Notes (Signed)
[place] 28 yo very irregular ovulation No response to clomid at all despite escalating doses regular intercourse No fathered children Minimal dysmenorrhea No dyspareunia  Past Medical History:  Diagnosis Date  . Asthma   . MTHFR gene mutation ([place])   . Restless leg syndrome     Past Surgical History:  Procedure Laterality Date  . APPENDECTOMY    . WISDOM TOOTH EXTRACTION      OB History    Gravida  0   Para  0   Term  0   Preterm  0   AB  0   Living  0     SAB  0   TAB  0   Ectopic  0   Multiple  0   Live Births  0           Allergies  Allergen Reactions  . Sulfa Antibiotics     Social History   Socioeconomic History  . Marital status: Married    Spouse name: Not on file  . Number of children: Not on file  . Years of education: Not on file  . Highest education level: Not on file  Occupational History  . Not on file  Tobacco Use  . Smoking status: Never Smoker  . Smokeless tobacco: Never Used  Substance and Sexual Activity  . Alcohol use: Yes    Comment: very rarely  . Drug use: No  . Sexual activity: Yes    Birth control/protection: None  Other Topics Concern  . Not on file  Social History Narrative  . Not on file   Social Determinants of Health   Financial Resource Strain:   . Difficulty of Paying Living Expenses:   Food Insecurity:   . Worried About Charity fundraiser in the Last Year:   . Arboriculturist in the Last Year:   Transportation Needs:   . Film/video editor (Medical):   Marland Kitchen Lack of Transportation (Non-Medical):   Physical Activity:   . Days of Exercise per Week:   . Minutes of Exercise per Session:   Stress:   . Feeling of Stress :   Social Connections:   . Frequency of Communication with Friends and Family:   . Frequency of Social Gatherings with Friends and Family:   . Attends Religious Services:   . Active Member of Clubs or Organizations:   . Attends Archivist Meetings:   Marland Kitchen  Marital Status:     Family History  Problem Relation Age of Onset  . Alzheimer's disease Paternal Grandfather   . Hyperlipidemia Father   . Fibromyalgia Mother   . Osteoporosis Mother   . Other Brother        has hearing aids   This patient will need more aggressive ovulation induction that will require referral to [facility] Pt given chance to think about my advise and will let us know if she wants to proceed with referral

## 2019-11-15 ENCOUNTER — Encounter: Payer: Self-pay | Admitting: *Deleted

## 2020-03-19 ENCOUNTER — Other Ambulatory Visit: Payer: Self-pay | Admitting: Adult Health

## 2020-03-20 ENCOUNTER — Other Ambulatory Visit: Payer: Self-pay | Admitting: Adult Health

## 2020-04-15 ENCOUNTER — Other Ambulatory Visit: Payer: Self-pay | Admitting: Women's Health

## 2020-04-24 ENCOUNTER — Other Ambulatory Visit: Payer: Self-pay | Admitting: Adult Health

## 2020-04-24 MED ORDER — TRINATE PO TABS: ORAL_TABLET | ORAL | 12 refills | Status: DC

## 2020-04-24 NOTE — Progress Notes (Signed)
Refill PNV 

## 2020-04-28 ENCOUNTER — Other Ambulatory Visit: Payer: Self-pay | Admitting: Women's Health

## 2020-05-17 ENCOUNTER — Other Ambulatory Visit: Payer: Self-pay | Admitting: Women's Health

## 2020-12-08 NOTE — L&D Delivery Note (Addendum)
OB/GYN Faculty Practice Delivery Note  Anna Guzman is a 30 y.o. G1P0000 s/p VD at [redacted]w[redacted]d. She was admitted for gestational hypertension, with concern for preeclampsia with severe features.   ROM: 4h 73m with clear fluid GBS Status: Unknown, received adequate Ancef d/t reported mild PCN allergy   Maximum Maternal Temperature: 100F  Labor Progress: Initial SVE: 1.5/30/-3. She then progressed to complete with assistance of FB, cytotec, pit, and AROM.    Delivery Date/Time: 09/26/21 at 1452 Delivery: Called to room and patient was complete and pushing. Head delivered midline OA. No nuchal cord present. Shoulder and body delivered in usual fashion. Infant with spontaneous cry, placed on mother's abdomen, dried and stimulated. Cord clamped x 2 after 1-minute delay, and cut by FOB. Cord blood drawn. Placenta delivered spontaneously with gentle cord traction. Fundus firm with massage and Pitocin. Labia, perineum, vagina, and cervix inspected with a hemostatic right perineal abrasion that did not require repair. A manual lower uterine sweep was performed with no clots retrieved.   Baby Weight: pending  Placenta: 3 vessel, intact. Sent to pathology Complications: None Lacerations: Right perineal abrasion, not repaired  EBL: 100 mL Analgesia: Epidural   Infant:  APGAR (1 MIN): 9 APGAR (5 MINS): 9  Leticia Penna, DO  OB Family Medicine Fellow, [facility] for [facility], [facility] 09/26/2021, 4:45 PM

## 2021-01-25 ENCOUNTER — Other Ambulatory Visit: Payer: Self-pay | Admitting: Women's Health

## 2021-02-12 ENCOUNTER — Other Ambulatory Visit: Payer: Self-pay | Admitting: Women's Health

## 2021-03-07 ENCOUNTER — Telehealth: Payer: Self-pay | Admitting: Women's Health

## 2021-03-07 NOTE — Telephone Encounter (Signed)
LMOVM returning patient's call.  Advised to call back after 1:30.

## 2021-03-07 NOTE — Telephone Encounter (Signed)
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

## 2021-03-25 ENCOUNTER — Other Ambulatory Visit: Payer: Self-pay

## 2021-03-25 ENCOUNTER — Ambulatory Visit: Payer: Self-pay | Admitting: *Deleted

## 2021-03-25 ENCOUNTER — Other Ambulatory Visit (HOSPITAL_COMMUNITY)
Admission: RE | Admit: 2021-03-25 | Discharge: 2021-03-25 | Disposition: A | Discharge status: Home / Self Care | Payer: BC Managed Care – PPO | Source: Ambulatory Visit | Attending: Obstetrics & Gynecology | Admitting: Obstetrics & Gynecology

## 2021-03-25 ENCOUNTER — Ambulatory Visit (INDEPENDENT_AMBULATORY_CARE_PROVIDER_SITE_OTHER): Payer: BC Managed Care – PPO | Admitting: Women's Health

## 2021-03-25 ENCOUNTER — Encounter: Payer: Self-pay | Admitting: Women's Health

## 2021-03-25 VITALS — BP 138/71 | HR 69 | Wt 131.0 lb

## 2021-03-25 DIAGNOSIS — N979 Female infertility, unspecified: Secondary | ICD-10-CM

## 2021-03-25 DIAGNOSIS — Z3400 Encounter for supervision of normal first pregnancy, unspecified trimester: Secondary | ICD-10-CM | POA: Insufficient documentation

## 2021-03-25 DIAGNOSIS — Z3A10 10 weeks gestation of pregnancy: Secondary | ICD-10-CM | POA: Diagnosis present

## 2021-03-25 DIAGNOSIS — O0990 Supervision of high risk pregnancy, unspecified, unspecified trimester: Secondary | ICD-10-CM | POA: Insufficient documentation

## 2021-03-25 DIAGNOSIS — Z3401 Encounter for supervision of normal first pregnancy, first trimester: Secondary | ICD-10-CM | POA: Diagnosis not present

## 2021-03-25 LAB — POCT URINALYSIS DIPSTICK OB
Blood, UA: NEGATIVE
Glucose, UA: NEGATIVE
Ketones, UA: NEGATIVE
Leukocytes, UA: NEGATIVE
Nitrite, UA: NEGATIVE
POC,PROTEIN,UA: NEGATIVE

## 2021-03-25 NOTE — Patient Instructions (Signed)
Anna Guzman, I greatly value your feedback.  If you receive a survey following your visit with [place] today, we appreciate you taking the time to fill it out.  Thanks, Joellyn Haff, CNM, WHNP-BC   [facility] ([address] [place], [place] [address]) Entrance C, located off of [address] Free 24/7 valet parking   Nausea & Vomiting  Have saltine crackers or pretzels by your bed and eat a few bites before you raise your head out of bed in the morning  Eat small frequent meals throughout the day instead of large meals  Drink plenty of fluids throughout the day to stay hydrated, just don't drink a lot of fluids with your meals.  This can make your stomach fill up faster making you feel sick  Do not brush your teeth right after you eat  Products with real ginger are good for nausea, like ginger ale and ginger hard candy Make sure it says made with real ginger!  Sucking on sour candy like lemon heads is also good for nausea  If your prenatal vitamins make you nauseated, take them at night so you will sleep through the nausea  Sea Bands  If you feel like you need medicine for the nausea & vomiting please let us know  If you are unable to keep any fluids or food down please let us know   Constipation  Drink plenty of fluid, preferably water, throughout the day  Eat foods high in fiber such as fruits, vegetables, and grains  Exercise, such as walking, is a good way to keep your bowels regular  Drink warm fluids, especially warm prune juice, or decaf coffee  Eat a 1/2 cup of real oatmeal (not instant), 1/2 cup applesauce, and 1/2-1 cup warm prune juice every day  If needed, you may take Colace (docusate sodium) stool softener once or twice a day to help keep the stool soft.   If you still are having problems with constipation, you may take Miralax once daily as needed to help keep your bowels regular.   Home Blood Pressure Monitoring for Patients    Your provider has recommended that you check your blood pressure (BP) at least once a week at home. If you do not have a blood pressure cuff at home, one will be provided for you. Contact your provider if you have not received your monitor within 1 week.   Helpful Tips for Accurate Home Blood Pressure Checks  . Don't smoke, exercise, or drink caffeine 30 minutes before checking your BP . Use the restroom before checking your BP (a full bladder can raise your pressure) . Relax in a comfortable upright chair . Feet on the ground . Left arm resting comfortably on a flat surface at the level of your heart . Legs uncrossed . Back supported . Sit quietly and don't talk . Place the cuff on your bare arm . Adjust snuggly, so that only two fingertips can fit between your skin and the top of the cuff . Check 2 readings separated by at least one minute . Keep a log of your BP readings . For a visual, please reference this diagram: http://ccnc.care/bpdiagram  Provider Name: [facility]     Phone: [phone]  Zone 1: ALL CLEAR  Continue to monitor your symptoms:  . BP reading is less than 140 (top number) or less than 90 (bottom number)  . No right upper stomach pain . No headaches or seeing  spots . No feeling nauseated or throwing up . No swelling in face and hands  Zone 2: CAUTION Call your doctor's office for any of the following:  . BP reading is greater than 140 (top number) or greater than 90 (bottom number)  . Stomach pain under your ribs in the middle or right side . Headaches or seeing spots . Feeling nauseated or throwing up . Swelling in face and hands  Zone 3: EMERGENCY  Seek immediate medical care if you have any of the following:  . BP reading is greater than160 (top number) or greater than 110 (bottom number) . Severe headaches not improving with Tylenol . Serious difficulty catching your breath . Any worsening symptoms from Zone 2    First Trimester of  Pregnancy The first trimester of pregnancy is from week 1 until the end of week 12 (months 1 through 3). A week after a sperm fertilizes an egg, the egg will implant on the wall of the uterus. This embryo will begin to develop into a baby. Genes from you and your partner are forming the baby. The female genes determine whether the baby is a boy or a girl. At 6-8 weeks, the eyes and face are formed, and the heartbeat can be seen on ultrasound. At the end of 12 weeks, all the baby's organs are formed.  Now that you are pregnant, you will want to do everything you can to have a healthy baby. Two of the most important things are to get good prenatal care and to follow your health care provider's instructions. Prenatal care is all the medical care you receive before the baby's birth. This care will help prevent, find, and treat any problems during the pregnancy and childbirth. BODY CHANGES Your body goes through many changes during pregnancy. The changes vary from woman to woman.   You may gain or lose a couple of pounds at first.  You may feel sick to your stomach (nauseous) and throw up (vomit). If the vomiting is uncontrollable, call your health care provider.  You may tire easily.  You may develop headaches that can be relieved by medicines approved by your health care provider.  You may urinate more often. Painful urination may mean you have a bladder infection.  You may develop heartburn as a result of your pregnancy.  You may develop constipation because certain hormones are causing the muscles that push waste through your intestines to slow down.  You may develop hemorrhoids or swollen, bulging veins (varicose veins).  Your breasts may begin to grow larger and become tender. Your nipples may stick out more, and the tissue that surrounds them (areola) may become darker.  Your gums may bleed and may be sensitive to brushing and flossing.  Dark spots or blotches (chloasma, mask of pregnancy)  may develop on your face. This will likely fade after the baby is born.  Your menstrual periods will stop.  You may have a loss of appetite.  You may develop cravings for certain kinds of food.  You may have changes in your emotions from day to day, such as being excited to be pregnant or being concerned that something may go wrong with the pregnancy and baby.  You may have more vivid and strange dreams.  You may have changes in your hair. These can include thickening of your hair, rapid growth, and changes in texture. Some women also have hair loss during or after pregnancy, or hair that feels dry or thin. Your hair  will most likely return to normal after your baby is born. WHAT TO EXPECT AT YOUR PRENATAL VISITS During a routine prenatal visit:  You will be weighed to make sure you and the baby are growing normally.  Your blood pressure will be taken.  Your abdomen will be measured to track your baby's growth.  The fetal heartbeat will be listened to starting around week 10 or 12 of your pregnancy.  Test results from any previous visits will be discussed. Your health care provider may ask you:  How you are feeling.  If you are feeling the baby move.  If you have had any abnormal symptoms, such as leaking fluid, bleeding, severe headaches, or abdominal cramping.  If you have any questions. Other tests that may be performed during your first trimester include:  Blood tests to find your blood type and to check for the presence of any previous infections. They will also be used to check for low iron levels (anemia) and Rh antibodies. Later in the pregnancy, blood tests for diabetes will be done along with other tests if problems develop.  Urine tests to check for infections, diabetes, or protein in the urine.  An ultrasound to confirm the proper growth and development of the baby.  An amniocentesis to check for possible genetic problems.  Fetal screens for spina bifida and  Down syndrome.  You may need other tests to make sure you and the baby are doing well. HOME CARE INSTRUCTIONS  Medicines  Follow your health care provider's instructions regarding medicine use. Specific medicines may be either safe or unsafe to take during pregnancy.  Take your prenatal vitamins as directed.  If you develop constipation, try taking a stool softener if your health care provider approves. Diet  Eat regular, well-balanced meals. Choose a variety of foods, such as meat or vegetable-based protein, fish, milk and low-fat dairy products, vegetables, fruits, and whole grain breads and cereals. Your health care provider will help you determine the amount of weight gain that is right for you.  Avoid raw meat and uncooked cheese. These carry germs that can cause birth defects in the baby.  Eating four or five small meals rather than three large meals a day may help relieve nausea and vomiting. If you start to feel nauseous, eating a few soda crackers can be helpful. Drinking liquids between meals instead of during meals also seems to help nausea and vomiting.  If you develop constipation, eat more high-fiber foods, such as fresh vegetables or fruit and whole grains. Drink enough fluids to keep your urine clear or pale yellow. Activity and Exercise  Exercise only as directed by your health care provider. Exercising will help you:  Control your weight.  Stay in shape.  Be prepared for labor and delivery.  Experiencing pain or cramping in the lower abdomen or low back is a good sign that you should stop exercising. Check with your health care provider before continuing normal exercises.  Try to avoid standing for long periods of time. Move your legs often if you must stand in one place for a long time.  Avoid heavy lifting.  Wear low-heeled shoes, and practice good posture.  You may continue to have sex unless your health care provider directs you otherwise. Relief of Pain  or Discomfort  Wear a good support bra for breast tenderness.    Take warm sitz baths to soothe any pain or discomfort caused by hemorrhoids. Use hemorrhoid cream if your health care provider approves.  Rest with your legs elevated if you have leg cramps or low back pain.  If you develop varicose veins in your legs, wear support hose. Elevate your feet for 15 minutes, 3-4 times a day. Limit salt in your diet. Prenatal Care  Schedule your prenatal visits by the twelfth week of pregnancy. They are usually scheduled monthly at first, then more often in the last 2 months before delivery.  Write down your questions. Take them to your prenatal visits.  Keep all your prenatal visits as directed by your health care provider. Safety  Wear your seat belt at all times when driving.  Make a list of emergency phone numbers, including numbers for family, friends, the hospital, and police and fire departments. General Tips  Ask your health care provider for a referral to a local prenatal education class. Begin classes no later than at the beginning of month 6 of your pregnancy.  Ask for help if you have counseling or nutritional needs during pregnancy. Your health care provider can offer advice or refer you to specialists for help with various needs.  Do not use hot tubs, steam rooms, or saunas.  Do not douche or use tampons or scented sanitary pads.  Do not cross your legs for long periods of time.  Avoid cat litter boxes and soil used by cats. These carry germs that can cause birth defects in the baby and possibly loss of the fetus by miscarriage or stillbirth.  Avoid all smoking, herbs, alcohol, and medicines not prescribed by your health care provider. Chemicals in these affect the formation and growth of the baby.  Schedule a dentist appointment. At home, brush your teeth with a soft toothbrush and be gentle when you floss. SEEK MEDICAL CARE IF:   You have dizziness.  You have mild  pelvic cramps, pelvic pressure, or nagging pain in the abdominal area.  You have persistent nausea, vomiting, or diarrhea.  You have a bad smelling vaginal discharge.  You have pain with urination.  You notice increased swelling in your face, hands, legs, or ankles. SEEK IMMEDIATE MEDICAL CARE IF:   You have a fever.  You are leaking fluid from your vagina.  You have spotting or bleeding from your vagina.  You have severe abdominal cramping or pain.  You have rapid weight gain or loss.  You vomit blood or material that looks like coffee grounds.  You are exposed to [place] measles and have never had them.  You are exposed to fifth disease or chickenpox.  You develop a severe headache.  You have shortness of breath.  You have any kind of trauma, such as from a fall or a car accident. Document Released: 11/18/2001 Document Revised: 04/10/2014 Document Reviewed: 10/04/2013 [facility] Patient Information 2015 [place], [place]. This information is not intended to replace advice given to you by your health care provider. Make sure you discuss any questions you have with your health care provider.

## 2021-03-25 NOTE — Progress Notes (Signed)
INITIAL OBSTETRICAL VISIT Patient name: Anna Guzman MRN [id number]  Date of birth: [birth year] Chief Complaint:   Initial Prenatal Visit  History of Present Illness:   Anna Guzman is a 30 y.o. G45P0000 Caucasian female at [redacted]w[redacted]d by date of IUI (2/19) c/w 7wk u/s, with an Estimated Date of Delivery: 10/19/21 being seen today for her initial obstetrical visit.  She and her husband have been trying to conceive for 34 months. Has been seeing [facility] at [facility] Jan [facility] after being referred from our office. Finished progesterone supplement last week.  Her obstetrical history is significant for primigravida.   Today she reports nausea, no vomiting. Diclegis helps, only taking 1 at night. PNV make her sick despite taking them at night.  Depression screen [facility] 2/9 03/25/2021 10/06/2018 12/28/2017 10/01/2016  Decreased Interest 0 0 0 0  Down, Depressed, Hopeless 0 0 0 0  PHQ - 2 Score 0 0 0 0  Altered sleeping 0 0 - -  Tired, decreased energy 1 0 - -  Change in appetite 0 0 - -  Feeling bad or failure about yourself  0 0 - -  Trouble concentrating 0 0 - -  Moving slowly or fidgety/restless 0 0 - -  Suicidal thoughts 0 0 - -  PHQ-9 Score 1 0 - -    No LMP recorded. Patient is pregnant. Last pap 10/01/16. Results were: NILM w/ HRHPV not done Review of Systems:   Pertinent items are noted in HPI Denies cramping/contractions, leakage of fluid, vaginal bleeding, abnormal vaginal discharge w/ itching/odor/irritation, headaches, visual changes, shortness of breath, chest pain, abdominal pain, severe nausea/vomiting, or problems with urination or bowel movements unless otherwise stated above.  Pertinent History Reviewed:  Reviewed past medical,surgical, social, obstetrical and family history.  Reviewed problem list, medications and allergies. OB History  Gravida Para Term Preterm AB Living  1 0 0 0 0 0  SAB IAB Ectopic Multiple Live Births  0 0 0 0 0    # Outcome Date  GA Lbr Len/2nd Weight Sex Delivery Anes PTL Lv  1 Current            Physical Assessment:   Vitals:   03/25/21 1517  BP: 138/71  Pulse: 69  Weight: 131 lb (59.4 kg)  Body mass index is 21.8 kg/m.       Physical Examination:  General appearance - well appearing, and in no distress  Mental status - alert, oriented to person, place, and time  Psych:  She has a normal mood and affect  Skin - warm and dry, normal color, no suspicious lesions noted  Chest - effort normal, all lung fields clear to auscultation bilaterally  Heart - normal rate and regular rhythm  Abdomen - soft, nontender  Extremities:  No swelling or varicosities noted  Pelvic - VULVA: normal appearing vulva with no masses, tenderness or lesions  VAGINA: normal appearing vagina with normal color and discharge, no lesions  CERVIX: normal appearing cervix without discharge or lesions, no CMT  Thin prep pap is done w/ HR HPV cotesting  Chaperone: Faith Rogue    TODAY'S FHT via doppler: 150  Results for orders placed or performed in visit on 03/25/21 (from the past 24 hour(s))  POC Urinalysis Dipstick OB   Collection Time: 03/25/21  3:40 PM  Result Value Ref Range   Color, UA     Clarity, UA     Glucose, UA Negative Negative   Bilirubin, UA  Ketones, UA neg    Spec Grav, UA     Blood, UA neg    pH, UA     POC,PROTEIN,UA Negative Negative, Trace, Small (1+), Moderate (2+), Large (3+), 4+   Urobilinogen, UA     Nitrite, UA neg    Leukocytes, UA Negative Negative   Appearance     Odor    CBC/D/Plt+RPR+Rh+ABO+Rub Ab...   Collection Time: 03/25/21  4:36 PM  Result Value Ref Range   Hepatitis B Surface Ag Negative Negative   HCV Ab <0.1 0.0 - 0.9 s/co ratio   RPR Ser Ql Non Reactive Non Reactive   Rubella Antibodies, IGG 6.20 Immune >0.99 index   ABO Grouping A    Rh Factor Positive    Antibody Screen Negative Negative   HIV Screen 4th Generation wRfx Non Reactive Non Reactive   WBC 8.8 3.4 - 10.8  x10E3/uL   RBC 4.28 3.77 - 5.28 x10E6/uL   Hemoglobin 12.8 11.1 - 15.9 g/dL   Hematocrit 29.4 76.5 - 46.6 %   MCV 86 79 - 97 fL   MCH 29.9 26.6 - 33.0 pg   MCHC 35.0 31.5 - 35.7 g/dL   RDW 46.5 03.5 - 46.5 %   Platelets 366 150 - 450 x10E3/uL   Neutrophils 59 Not Estab. %   Lymphs 32 Not Estab. %   Monocytes 8 Not Estab. %   Eos 1 Not Estab. %   Basos 0 Not Estab. %   Neutrophils Absolute 5.1 1.4 - 7.0 x10E3/uL   Lymphocytes Absolute 2.9 0.7 - 3.1 x10E3/uL   Monocytes Absolute 0.7 0.1 - 0.9 x10E3/uL   EOS (ABSOLUTE) 0.1 0.0 - 0.4 x10E3/uL   Basophils Absolute 0.0 0.0 - 0.2 x10E3/uL   Immature Granulocytes 0 Not Estab. %   Immature Grans (Abs) 0.0 0.0 - 0.1 x10E3/uL  Interpretation:   Collection Time: 03/25/21  4:36 PM  Result Value Ref Range   HCV Interp 1: Comment   Pain Management Screening Profile (10S)   Collection Time: 03/25/21  4:40 PM  Result Value Ref Range   Amphetamine Scrn, Ur Negative Cutoff=1000 ng/mL   BARBITURATE SCREEN URINE Negative Cutoff=200 ng/mL   BENZODIAZEPINE SCREEN, URINE Negative Cutoff=200 ng/mL   CANNABINOIDS UR QL SCN Negative Cutoff=20 ng/mL   Cocaine (Metab) Scrn, Ur Negative Cutoff=300 ng/mL   Opiate Scrn, Ur Negative Cutoff=300 ng/mL   OXYCODONE+OXYMORPHONE UR QL SCN Negative Cutoff=100 ng/mL   Phencyclidine Qn, Ur Negative Cutoff=25 ng/mL   Methadone Screen, Urine Negative Cutoff=300 ng/mL   Propoxyphene Scrn, Ur Negative Cutoff=300 ng/mL   Creatinine(Crt), U 124.9 20.0 - 300.0 mg/dL   Ph of Urine 6.6 4.5 - 8.9   PLEASE NOTE: Comment     Assessment & Plan:  1) Low-Risk Pregnancy G1P0000 at [redacted]w[redacted]d with an Estimated Date of Delivery: 10/19/21   2) Initial OB visit  3) Pregnancy result of IUI> through [facility] in [place]  4) Hypothyroidism> on synthroid, last TSH 1.85 on 3/28  Meds: No orders of the defined types were placed in this encounter.   Initial labs obtained Continue prenatal vitamins Reviewed n/v relief measures and warning  s/s to report Reviewed recommended weight gain based on pre-gravid BMI Encouraged well-balanced diet Genetic & carrier screening discussed: declines Panorama, NT/IT, AFP and Horizon 14  Ultrasound discussed; fetal survey: requested CCNC completed> form faxed if has or is planning to apply for medicaid The nature of [employer] for [employer] with multiple MDs and other Warehouse manager  Providers was explained to patient; also emphasized that fellows, residents, and students are part of our team. Does have home bp cuff. Office bp cuff given: no. Check bp weekly, let us know if consistently >140/90.   Follow-up: Return in about 4 weeks (around 04/22/2021) for LROB, CNM, in person.   Orders Placed This Encounter  Procedures  . Urine Culture  . Pain Management Screening Profile (10S)  . CBC/D/Plt+RPR+Rh+ABO+Rub Ab...  . Interpretation:  . POC Urinalysis Dipstick OB    Cheral Marker CNM, WHNP-BC 03/26/2021 1:20 PM

## 2021-03-26 DIAGNOSIS — E039 Hypothyroidism, unspecified: Secondary | ICD-10-CM | POA: Insufficient documentation

## 2021-03-26 DIAGNOSIS — N979 Female infertility, unspecified: Secondary | ICD-10-CM | POA: Insufficient documentation

## 2021-03-26 LAB — CBC/D/PLT+RPR+RH+ABO+RUB AB...
Antibody Screen: NEGATIVE
Basophils Absolute: 0 10*3/uL (ref 0.0–0.2)
Basos: 0 %
EOS (ABSOLUTE): 0.1 10*3/uL (ref 0.0–0.4)
Eos: 1 %
HCV Ab: <0.1 s/co ratio (ref 0.0–0.9)
HIV Screen 4th Generation wRfx: NONREACTIVE
Hematocrit: 36.6 % (ref 34.0–46.6)
Hemoglobin: 12.8 g/dL (ref 11.1–15.9)
Hepatitis B Surface Ag: NEGATIVE
Immature Grans (Abs): 0 10*3/uL (ref 0.0–0.1)
Immature Granulocytes: 0 %
Lymphocytes Absolute: 2.9 10*3/uL (ref 0.7–3.1)
Lymphs: 32 %
MCH: 29.9 pg (ref 26.6–33.0)
MCHC: 35 g/dL (ref 31.5–35.7)
MCV: 86 fL (ref 79–97)
Monocytes Absolute: 0.7 10*3/uL (ref 0.1–0.9)
Monocytes: 8 %
Neutrophils Absolute: 5.1 10*3/uL (ref 1.4–7.0)
Neutrophils: 59 %
Platelets: 366 10*3/uL (ref 150–450)
RBC: 4.28 x10E6/uL (ref 3.77–5.28)
RDW: 12.5 % (ref 11.7–15.4)
RPR Ser Ql: NONREACTIVE
Rh Factor: POSITIVE
Rubella Antibodies, IGG: 6.2 index (ref >0.99)
WBC: 8.8 10*3/uL (ref 3.4–10.8)

## 2021-03-26 LAB — PMP SCREEN PROFILE (10S), URINE
Amphetamine Scrn, Ur: NEGATIVE ng/mL
BARBITURATE SCREEN URINE: NEGATIVE ng/mL
BENZODIAZEPINE SCREEN, URINE: NEGATIVE ng/mL
CANNABINOIDS UR QL SCN: NEGATIVE ng/mL
Cocaine (Metab) Scrn, Ur: NEGATIVE ng/mL
Creatinine(Crt), U: 124.9 mg/dL (ref 20.0–300.0)
Methadone Screen, Urine: NEGATIVE ng/mL
OXYCODONE+OXYMORPHONE UR QL SCN: NEGATIVE ng/mL
Opiate Scrn, Ur: NEGATIVE ng/mL
Ph of Urine: 6.6 (ref 4.5–8.9)
Phencyclidine Qn, Ur: NEGATIVE ng/mL
Propoxyphene Scrn, Ur: NEGATIVE ng/mL

## 2021-03-26 LAB — HCV INTERPRETATION

## 2021-03-27 LAB — URINE CULTURE

## 2021-03-29 LAB — CYTOLOGY - PAP
Chlamydia: NEGATIVE
Comment: NEGATIVE
Comment: NEGATIVE
Comment: NORMAL
Diagnosis: NEGATIVE
High risk HPV: NEGATIVE
Neisseria Gonorrhea: NEGATIVE

## 2021-04-25 ENCOUNTER — Ambulatory Visit (INDEPENDENT_AMBULATORY_CARE_PROVIDER_SITE_OTHER): Payer: BC Managed Care – PPO | Admitting: Advanced Practice Midwife

## 2021-04-25 ENCOUNTER — Encounter: Payer: Self-pay | Admitting: Advanced Practice Midwife

## 2021-04-25 ENCOUNTER — Other Ambulatory Visit: Payer: Self-pay

## 2021-04-25 VITALS — BP 131/78 | HR 92 | Wt 132.0 lb

## 2021-04-25 DIAGNOSIS — Z3402 Encounter for supervision of normal first pregnancy, second trimester: Secondary | ICD-10-CM

## 2021-04-25 DIAGNOSIS — Z3A14 14 weeks gestation of pregnancy: Secondary | ICD-10-CM

## 2021-04-25 NOTE — Patient Instructions (Signed)
Anna Guzman, I greatly value your feedback.  If you receive a survey following your visit with [place] today, we appreciate you taking the time to fill it out.  Thanks, [name], CNM     [facility] HAS MOVED!!! It is now [facility] ([address] [place], [place] [address]) Entrance located off of [address] Free 24/7 valet parking   Go to [email or web address] to register for FREE online childbirth classes    Second Trimester of Pregnancy The second trimester is from week 14 through week 27 (months 4 through 6). The second trimester is often a time when you feel your best. Your body has adjusted to being pregnant, and you begin to feel better physically. Usually, morning sickness has lessened or quit completely, you may have more energy, and you may have an increase in appetite. The second trimester is also a time when the fetus is growing rapidly. At the end of the sixth month, the fetus is about 9 inches long and weighs about 1 pounds. You will likely begin to feel the baby move (quickening) between 16 and 20 weeks of pregnancy. Body changes during your second trimester Your body continues to go through many changes during your second trimester. The changes vary from woman to woman.  Your weight will continue to increase. You will notice your lower abdomen bulging out.  You may begin to get stretch marks on your hips, abdomen, and breasts.  You may develop headaches that can be relieved by medicines. The medicines should be approved by your health care provider.  You may urinate more often because the fetus is pressing on your bladder.  You may develop or continue to have heartburn as a result of your pregnancy.  You may develop constipation because certain hormones are causing the muscles that push waste through your intestines to slow down.  You may develop hemorrhoids or swollen, bulging veins (varicose veins).  You may have back  pain. This is caused by: ? Weight gain. ? Pregnancy hormones that are relaxing the joints in your pelvis. ? A shift in weight and the muscles that support your balance.  Your breasts will continue to grow and they will continue to become tender.  Your gums may bleed and may be sensitive to brushing and flossing.  Dark spots or blotches (chloasma, mask of pregnancy) may develop on your face. This will likely fade after the baby is born.  A dark line from your belly button to the pubic area (linea nigra) may appear. This will likely fade after the baby is born.  You may have changes in your hair. These can include thickening of your hair, rapid growth, and changes in texture. Some women also have hair loss during or after pregnancy, or hair that feels dry or thin. Your hair will most likely return to normal after your baby is born.  What to expect at prenatal visits During a routine prenatal visit:  You will be weighed to make sure you and the fetus are growing normally.  Your blood pressure will be taken.  Your abdomen will be measured to track your baby's growth.  The fetal heartbeat will be listened to.  Any test results from the previous visit will be discussed.  Your health care provider may ask you:  How you are feeling.  If you are feeling the baby move.  If you have had any abnormal symptoms, such as leaking fluid, bleeding, severe headaches, or abdominal  cramping.  If you are using any tobacco products, including cigarettes, chewing tobacco, and electronic cigarettes.  If you have any questions.  Other tests that may be performed during your second trimester include:  Blood tests that check for: ? Low iron levels (anemia). ? High blood sugar that affects pregnant women (gestational diabetes) between 65 and 28 weeks. ? Rh antibodies. This is to check for a protein on red blood cells (Rh factor).  Urine tests to check for infections, diabetes, or protein in the  urine.  An ultrasound to confirm the proper growth and development of the baby.  An amniocentesis to check for possible genetic problems.  Fetal screens for spina bifida and Down syndrome.  HIV (human immunodeficiency virus) testing. Routine prenatal testing includes screening for HIV, unless you choose not to have this test.  Follow these instructions at home: Medicines  Follow your health care provider's instructions regarding medicine use. Specific medicines may be either safe or unsafe to take during pregnancy.  Take a prenatal vitamin that contains at least 600 micrograms (mcg) of folic acid.  If you develop constipation, try taking a stool softener if your health care provider approves. Eating and drinking  Eat a balanced diet that includes fresh fruits and vegetables, whole grains, good sources of protein such as meat, eggs, or tofu, and low-fat dairy. Your health care provider will help you determine the amount of weight gain that is right for you.  Avoid raw meat and uncooked cheese. These carry germs that can cause birth defects in the baby.  If you have low calcium intake from food, talk to your health care provider about whether you should take a daily calcium supplement.  Limit foods that are high in fat and processed sugars, such as fried and sweet foods.  To prevent constipation: ? Drink enough fluid to keep your urine clear or pale yellow. ? Eat foods that are high in fiber, such as fresh fruits and vegetables, whole grains, and beans. Activity  Exercise only as directed by your health care provider. Most women can continue their usual exercise routine during pregnancy. Try to exercise for 30 minutes at least 5 days a week. Stop exercising if you experience uterine contractions.  Avoid heavy lifting, wear low heel shoes, and practice good posture.  A sexual relationship may be continued unless your health care provider directs you otherwise. Relieving pain and  discomfort  Wear a good support bra to prevent discomfort from breast tenderness.  Take warm sitz baths to soothe any pain or discomfort caused by hemorrhoids. Use hemorrhoid cream if your health care provider approves.  Rest with your legs elevated if you have leg cramps or low back pain.  If you develop varicose veins, wear support hose. Elevate your feet for 15 minutes, 3-4 times a day. Limit salt in your diet. Prenatal Care  Write down your questions. Take them to your prenatal visits.  Keep all your prenatal visits as told by your health care provider. This is important. Safety  Wear your seat belt at all times when driving.  Make a list of emergency phone numbers, including numbers for family, friends, the hospital, and police and fire departments. General instructions  Ask your health care provider for a referral to a local prenatal education class. Begin classes no later than the beginning of month 6 of your pregnancy.  Ask for help if you have counseling or nutritional needs during pregnancy. Your health care provider can offer advice or refer  you to specialists for help with various needs.  Do not use hot tubs, steam rooms, or saunas.  Do not douche or use tampons or scented sanitary pads.  Do not cross your legs for long periods of time.  Avoid cat litter boxes and soil used by cats. These carry germs that can cause birth defects in the baby and possibly loss of the fetus by miscarriage or stillbirth.  Avoid all smoking, herbs, alcohol, and unprescribed drugs. Chemicals in these products can affect the formation and growth of the baby.  Do not use any products that contain nicotine or tobacco, such as cigarettes and e-cigarettes. If you need help quitting, ask your health care provider.  Visit your dentist if you have not gone yet during your pregnancy. Use a soft toothbrush to brush your teeth and be gentle when you floss. Contact a health care provider if:  You  have dizziness.  You have mild pelvic cramps, pelvic pressure, or nagging pain in the abdominal area.  You have persistent nausea, vomiting, or diarrhea.  You have a bad smelling vaginal discharge.  You have pain when you urinate. Get help right away if:  You have a fever.  You are leaking fluid from your vagina.  You have spotting or bleeding from your vagina.  You have severe abdominal cramping or pain.  You have rapid weight gain or weight loss.  You have shortness of breath with chest pain.  You notice sudden or extreme swelling of your face, hands, ankles, feet, or legs.  You have not felt your baby move in over an hour.  You have severe headaches that do not go away when you take medicine.  You have vision changes. Summary  The second trimester is from week 14 through week 27 (months 4 through 6). It is also a time when the fetus is growing rapidly.  Your body goes through many changes during pregnancy. The changes vary from woman to woman.  Avoid all smoking, herbs, alcohol, and unprescribed drugs. These chemicals affect the formation and growth your baby.  Do not use any tobacco products, such as cigarettes, chewing tobacco, and e-cigarettes. If you need help quitting, ask your health care provider.  Contact your health care provider if you have any questions. Keep all prenatal visits as told by your health care provider. This is important. This information is not intended to replace advice given to you by your health care provider. Make sure you discuss any questions you have with your health care provider.

## 2021-04-25 NOTE — Progress Notes (Signed)
   LOW-RISK PREGNANCY VISIT Patient name: Anna Guzman MRN [id number]  Date of birth: Aug 03, 1991 Chief Complaint:   Routine Prenatal Visit  History of Present Illness:   Micheala Morissette is a 30 y.o. G29P0000 female at [redacted]w[redacted]d with an Estimated Date of Delivery: 10/19/21 being seen today for ongoing management of a low-risk pregnancy.  Today she reports feeling much better. Contractions: Not present. Vag. Bleeding: None.   . denies leaking of fluid. Review of Systems:   Pertinent items are noted in HPI Denies abnormal vaginal discharge w/ itching/odor/irritation, headaches, visual changes, shortness of breath, chest pain, abdominal pain, severe nausea/vomiting, or problems with urination or bowel movements unless otherwise stated above. Pertinent History Reviewed:  Reviewed past medical,surgical, social, obstetrical and family history.  Reviewed problem list, medications and allergies. Physical Assessment:   Vitals:   04/25/21 1534  BP: 131/78  Pulse: 92  Weight: 132 lb (59.9 kg)  Body mass index is 21.97 kg/m.        Physical Examination:   General appearance: Well appearing, and in no distress  Mental status: Alert, oriented to person, place, and time  Skin: Warm & dry  Cardiovascular: Normal heart rate noted  Respiratory: Normal respiratory effort, no distress  Abdomen: Soft, gravid, nontender  Pelvic: Cervical exam deferred         Extremities: Edema: None  Fetal Status: Fetal Heart Rate (bpm): 150        Chaperone: n/a    No results found for this or any previous visit (from the past 24 hour(s)).  Assessment & Plan:  1) Low-risk pregnancy G1P0000 at [redacted]w[redacted]d with an Estimated Date of Delivery: 10/19/21     Meds: No orders of the defined types were placed in this encounter.  Labs/procedures today: none  Plan:  Continue routine obstetrical care  Next visit: prefers in person    Reviewed: general obstetric precautions including but not limited to vaginal bleeding,  contractions, leaking of fluid and fetal movement were reviewed in detail with the patient.  All questions were answered. Has home bp cuff. Check bp weekly, let us know if >140/90.   Follow-up: Return in about 4 weeks (around 05/23/2021) for [id number], LROB.  No orders of the defined types were placed in this encounter.  Jacklyn Shell DNP, CNM 04/25/2021 3:59 PM

## 2021-05-31 ENCOUNTER — Other Ambulatory Visit: Payer: Self-pay | Admitting: Advanced Practice Midwife

## 2021-05-31 DIAGNOSIS — Z363 Encounter for antenatal screening for malformations: Secondary | ICD-10-CM

## 2021-06-03 ENCOUNTER — Encounter: Payer: Self-pay | Admitting: Women's Health

## 2021-06-03 ENCOUNTER — Ambulatory Visit (INDEPENDENT_AMBULATORY_CARE_PROVIDER_SITE_OTHER): Payer: BC Managed Care – PPO | Admitting: Women's Health

## 2021-06-03 ENCOUNTER — Other Ambulatory Visit: Payer: Self-pay

## 2021-06-03 ENCOUNTER — Ambulatory Visit (INDEPENDENT_AMBULATORY_CARE_PROVIDER_SITE_OTHER): Payer: BC Managed Care – PPO

## 2021-06-03 VITALS — BP 130/69 | HR 90 | Wt 135.0 lb

## 2021-06-03 DIAGNOSIS — Z3402 Encounter for supervision of normal first pregnancy, second trimester: Secondary | ICD-10-CM

## 2021-06-03 DIAGNOSIS — Z363 Encounter for antenatal screening for malformations: Secondary | ICD-10-CM

## 2021-06-03 DIAGNOSIS — Z3A20 20 weeks gestation of pregnancy: Secondary | ICD-10-CM

## 2021-06-03 NOTE — Progress Notes (Signed)
    LOW-RISK PREGNANCY VISIT Patient name: Anna Guzman MRN [id number]  Date of birth: Sep 07, 1991 Chief Complaint:   Routine Prenatal Visit  History of Present Illness:   Anna Guzman is a 30 y.o. G86P0000 female at [redacted]w[redacted]d with an Estimated Date of Delivery: 10/19/21 being seen today for ongoing management of a low-risk pregnancy.   Today she reports no complaints. Contractions: Not present. Vag. Bleeding: None.  Movement: Present. denies leaking of fluid.  Depression screen [facility] 2/9 03/25/2021 10/06/2018 12/28/2017 10/01/2016  Decreased Interest 0 0 0 0  Down, Depressed, Hopeless 0 0 0 0  PHQ - 2 Score 0 0 0 0  Altered sleeping 0 0 - -  Tired, decreased energy 1 0 - -  Change in appetite 0 0 - -  Feeling bad or failure about yourself  0 0 - -  Trouble concentrating 0 0 - -  Moving slowly or fidgety/restless 0 0 - -  Suicidal thoughts 0 0 - -  PHQ-9 Score 1 0 - -     GAD 7 : Generalized Anxiety Score 03/25/2021  Nervous, Anxious, on Edge 0  Control/stop worrying 0  Worry too much - different things 0  Trouble relaxing 0  Restless 0  Easily annoyed or irritable 0  Afraid - awful might happen 0  Total GAD 7 Score 0      Review of Systems:   Pertinent items are noted in HPI Denies abnormal vaginal discharge w/ itching/odor/irritation, headaches, visual changes, shortness of breath, chest pain, abdominal pain, severe nausea/vomiting, or problems with urination or bowel movements unless otherwise stated above. Pertinent History Reviewed:  Reviewed past medical,surgical, social, obstetrical and family history.  Reviewed problem list, medications and allergies. Physical Assessment:   Vitals:   06/03/21 0955  BP: 130/69  Pulse: 90  Weight: 135 lb (61.2 kg)  Body mass index is 22.47 kg/m.        Physical Examination:   General appearance: Well appearing, and in no distress  Mental status: Alert, oriented to person, place, and time  Skin: Warm & dry  Cardiovascular: Normal  heart rate noted  Respiratory: Normal respiratory effort, no distress  Abdomen: Soft, gravid, nontender  Pelvic: Cervical exam deferred         Extremities: Edema: Trace  Fetal Status: Fetal Heart Rate (bpm): 137 u/s   Movement: Present   [place] 20+2 wks,cephalic,posterior placenta gr 0,normal ovaries,cx length 3.4 cm,svp of fluid 5.1 cm,fhr 137 bpm,EFW 410 g 89.9%,anatomy complete,no obvious abnormalities  Chaperone: N/A   No results found for this or any previous visit (from the past 24 hour(s)).  Assessment & Plan:  1) Low-risk pregnancy G1P0000 at [redacted]w[redacted]d with an Estimated Date of Delivery: 10/19/21    Meds: No orders of the defined types were placed in this encounter.  Labs/procedures today: U/S  Plan:  Continue routine obstetrical care  Next visit: prefers in person    Reviewed: Preterm labor symptoms and general obstetric precautions including but not limited to vaginal bleeding, contractions, leaking of fluid and fetal movement were reviewed in detail with the patient.  All questions were answered. Does have home bp cuff. Office bp cuff given: not applicable. Check bp weekly, let us know if consistently >140 and/or >90.  Follow-up: Return in about 4 weeks (around 07/01/2021) for LROB, CNM, in person.  No future appointments.  No orders of the defined types were placed in this encounter.  Cheral Marker CNM, [facility] 06/03/2021 10:21 AM

## 2021-06-03 NOTE — Progress Notes (Signed)
[place] 20+2 wks,cephalic,posterior placenta gr 0,normal ovaries,cx length 3.4 cm,svp of fluid 5.1 cm,fhr 137 bpm,EFW 410 g 89.9%,anatomy complete,no obvious abnormalities

## 2021-06-03 NOTE — Patient Instructions (Signed)
Anna Guzman, thank you for choosing our office today! We appreciate the opportunity to meet your healthcare needs. You may receive a short survey by mail, e-mail, or through [employer]. If you are happy with your care we would appreciate if you could take just a few minutes to complete the survey questions. We read all of your comments and take your feedback very seriously. Thank you again for choosing our office.  Center for [employer] Team at [facility] at [facility] ([address] [place], [place] [address]) Entrance C, located off of [address] Free 24/7 valet parking  Go to [email or web address] to register for FREE online childbirth classes  Call the office [phone]) or go to [facility] if: You begin to severe cramping Your water breaks.  Sometimes it is a big gush of fluid, sometimes it is just a trickle that keeps getting your panties wet or running down your legs You have vaginal bleeding.  It is normal to have a small amount of spotting if your cervix was checked.   [facility] Pediatricians/Family Doctors [facility]): [address] Colette Ribas, [phone]           [facility]: [address], [phone]                [facility]): [address], [phone] (call to ask if accepting patients) [facility]: [address], [place], [phone]    [facility] Pediatricians/Family Doctors [facility]): [phone] [address], [phone] [facility]: [address] [place], [phone] [facility] [place]: [address], [phone]  [facility] Doctors  [facility]): [phone] [facility]: [address], [phone]   [facility] Doctors [facility]: [address], [phone]  [facility] Doctors  [facility]: [phone], [phone]  Home Blood Pressure Monitoring for Patients   Your provider has recommended that you check your blood pressure (BP) at least once a week at home. If you do not have a blood pressure cuff at home, one will be provided for you. Contact your provider if you have not received your monitor within 1 week.   Helpful Tips for Accurate Home Blood Pressure Checks  Don't smoke, exercise, or drink caffeine 30 minutes before checking your BP Use the restroom before checking your BP (a full bladder can raise your pressure) Relax in a comfortable upright chair Feet on the ground Left arm resting comfortably on a flat surface at the level of your heart Legs uncrossed Back supported Sit quietly and don't talk Place the cuff on your bare arm Adjust snuggly, so that only two fingertips can fit between your skin and the top of the cuff Check 2 readings separated by at least one minute Keep a log of your BP readings For a visual, please reference this diagram: http://ccnc.care/bpdiagram  Provider Name: [facility]     Phone: [phone]  Zone 1: ALL CLEAR  Continue to monitor your symptoms:  BP reading is less than 140 (top number) or less than 90 (bottom number)  No right upper stomach pain No headaches or seeing spots No feeling nauseated or throwing up No swelling in face and hands  Zone 2: CAUTION Call your doctor's office for any of the following:  BP reading is greater than 140 (top number) or greater than  90 (bottom number)  Stomach pain under your ribs in the middle or right side Headaches or seeing spots Feeling nauseated or throwing up Swelling in face and hands  Zone 3: EMERGENCY  Seek immediate medical care if you have any of the following:  BP reading is greater than160 (top number) or greater than 110 (bottom number) Severe headaches not improving with Tylenol Serious difficulty catching your breath Any worsening symptoms from  Zone 2     Second Trimester of Pregnancy The second trimester is from week 14 through week 27 (months 4 through 6). The second trimester is often a time when you feel your best. Your body has adjusted to being pregnant, and you begin to feel better physically. Usually, morning sickness has lessened or quit completely, you may have more energy, and you may have an increase in appetite. The second trimester is also a time when the fetus is growing rapidly. At the end of the sixth month, the fetus is about 9 inches long and weighs about 1 pounds. You will likely begin to feel the baby move (quickening) between 16 and 20 weeks of pregnancy. Body changes during your second trimester Your body continues to go through many changes during your second trimester. The changes vary from woman to woman. Your weight will continue to increase. You will notice your lower abdomen bulging out. You may begin to get stretch marks on your hips, abdomen, and breasts. You may develop headaches that can be relieved by medicines. The medicines should be approved by your health care provider. You may urinate more often because the fetus is pressing on your bladder. You may develop or continue to have heartburn as a result of your pregnancy. You may develop constipation because certain hormones are causing the muscles that push waste through your intestines to slow down. You may develop hemorrhoids or swollen, bulging veins (varicose veins). You may have back pain. This is caused by: Weight gain. Pregnancy hormones that are relaxing the joints in your pelvis. A shift in weight and the muscles that support your balance. Your breasts will continue to grow and they will continue to become tender. Your gums may bleed and may be sensitive to brushing and flossing. Dark spots or blotches (chloasma, mask of pregnancy) may develop on your face. This will likely fade after the baby is born. A dark line from your belly button to  the pubic area (linea nigra) may appear. This will likely fade after the baby is born. You may have changes in your hair. These can include thickening of your hair, rapid growth, and changes in texture. Some women also have hair loss during or after pregnancy, or hair that feels dry or thin. Your hair will most likely return to normal after your baby is born.  What to expect at prenatal visits During a routine prenatal visit: You will be weighed to make sure you and the fetus are growing normally. Your blood pressure will be taken. Your abdomen will be measured to track your baby's growth. The fetal heartbeat will be listened to. Any test results from the previous visit will be discussed.  Your health care provider may ask you: How you are feeling. If you are feeling the baby move. If you have had any abnormal symptoms, such as leaking fluid, bleeding, severe headaches, or abdominal cramping. If you are using any tobacco products, including cigarettes, chewing tobacco, and electronic cigarettes. If you have any questions.  Other tests that may be performed during   your second trimester include: Blood tests that check for: Low iron levels (anemia). High blood sugar that affects pregnant women (gestational diabetes) between 24 and 28 weeks. Rh antibodies. This is to check for a protein on red blood cells (Rh factor). Urine tests to check for infections, diabetes, or protein in the urine. An ultrasound to confirm the proper growth and development of the baby. An amniocentesis to check for possible genetic problems. Fetal screens for spina bifida and Down syndrome. HIV (human immunodeficiency virus) testing. Routine prenatal testing includes screening for HIV, unless you choose not to have this test.  Follow these instructions at home: Medicines Follow your health care provider's instructions regarding medicine use. Specific medicines may be either safe or unsafe to take during  pregnancy. Take a prenatal vitamin that contains at least 600 micrograms (mcg) of folic acid. If you develop constipation, try taking a stool softener if your health care provider approves. Eating and drinking Eat a balanced diet that includes fresh fruits and vegetables, whole grains, good sources of protein such as meat, eggs, or tofu, and low-fat dairy. Your health care provider will help you determine the amount of weight gain that is right for you. Avoid raw meat and uncooked cheese. These carry germs that can cause birth defects in the baby. If you have low calcium intake from food, talk to your health care provider about whether you should take a daily calcium supplement. Limit foods that are high in fat and processed sugars, such as fried and sweet foods. To prevent constipation: Drink enough fluid to keep your urine clear or pale yellow. Eat foods that are high in fiber, such as fresh fruits and vegetables, whole grains, and beans. Activity Exercise only as directed by your health care provider. Most women can continue their usual exercise routine during pregnancy. Try to exercise for 30 minutes at least 5 days a week. Stop exercising if you experience uterine contractions. Avoid heavy lifting, wear low heel shoes, and practice good posture. A sexual relationship may be continued unless your health care provider directs you otherwise. Relieving pain and discomfort Wear a good support bra to prevent discomfort from breast tenderness. Take warm sitz baths to soothe any pain or discomfort caused by hemorrhoids. Use hemorrhoid cream if your health care provider approves. Rest with your legs elevated if you have leg cramps or low back pain. If you develop varicose veins, wear support hose. Elevate your feet for 15 minutes, 3-4 times a day. Limit salt in your diet. Prenatal Care Write down your questions. Take them to your prenatal visits. Keep all your prenatal visits as told by your health  care provider. This is important. Safety Wear your seat belt at all times when driving. Make a list of emergency phone numbers, including numbers for family, friends, the hospital, and police and fire departments. General instructions Ask your health care provider for a referral to a local prenatal education class. Begin classes no later than the beginning of month 6 of your pregnancy. Ask for help if you have counseling or nutritional needs during pregnancy. Your health care provider can offer advice or refer you to specialists for help with various needs. Do not use hot tubs, steam rooms, or saunas. Do not douche or use tampons or scented sanitary pads. Do not cross your legs for long periods of time. Avoid cat litter boxes and soil used by cats. These carry germs that can cause birth defects in the baby and possibly loss of the   fetus by miscarriage or stillbirth. Avoid all smoking, herbs, alcohol, and unprescribed drugs. Chemicals in these products can affect the formation and growth of the baby. Do not use any products that contain nicotine or tobacco, such as cigarettes and e-cigarettes. If you need help quitting, ask your health care provider. Visit your dentist if you have not gone yet during your pregnancy. Use a soft toothbrush to brush your teeth and be gentle when you floss. Contact a health care provider if: You have dizziness. You have mild pelvic cramps, pelvic pressure, or nagging pain in the abdominal area. You have persistent nausea, vomiting, or diarrhea. You have a bad smelling vaginal discharge. You have pain when you urinate. Get help right away if: You have a fever. You are leaking fluid from your vagina. You have spotting or bleeding from your vagina. You have severe abdominal cramping or pain. You have rapid weight gain or weight loss. You have shortness of breath with chest pain. You notice sudden or extreme swelling of your face, hands, ankles, feet, or legs. You  have not felt your baby move in over an hour. You have severe headaches that do not go away when you take medicine. You have vision changes. Summary The second trimester is from week 14 through week 27 (months 4 through 6). It is also a time when the fetus is growing rapidly. Your body goes through many changes during pregnancy. The changes vary from woman to woman. Avoid all smoking, herbs, alcohol, and unprescribed drugs. These chemicals affect the formation and growth your baby. Do not use any tobacco products, such as cigarettes, chewing tobacco, and e-cigarettes. If you need help quitting, ask your health care provider. Contact your health care provider if you have any questions. Keep all prenatal visits as told by your health care provider. This is important. This information is not intended to replace advice given to you by your health care provider. Make sure you discuss any questions you have with your health care provider. Document Released: 11/18/2001 Document Revised: 05/01/2016 Document Reviewed: 01/25/2013 [employer] Interactive Patient Education  2017 [employer]  

## 2021-07-08 ENCOUNTER — Ambulatory Visit (INDEPENDENT_AMBULATORY_CARE_PROVIDER_SITE_OTHER): Payer: BC Managed Care – PPO | Admitting: Women's Health

## 2021-07-08 ENCOUNTER — Encounter: Payer: Self-pay | Admitting: Women's Health

## 2021-07-08 ENCOUNTER — Other Ambulatory Visit: Payer: Self-pay

## 2021-07-08 VITALS — BP 125/75 | HR 113 | Wt 141.0 lb

## 2021-07-08 DIAGNOSIS — Z3402 Encounter for supervision of normal first pregnancy, second trimester: Secondary | ICD-10-CM

## 2021-07-08 DIAGNOSIS — Z3A25 25 weeks gestation of pregnancy: Secondary | ICD-10-CM

## 2021-07-08 NOTE — Patient Instructions (Addendum)
Anna Guzman, thank you for choosing our office today! We appreciate the opportunity to meet your healthcare needs. You may receive a short survey by mail, e-mail, or through [employer]. If you are happy with your care we would appreciate if you could take just a few minutes to complete the survey questions. We read all of your comments and take your feedback very seriously. Thank you again for choosing our office.  Center for [employer] Team at [facility]  [facility] at [facility] ([address], [place] [address]) Entrance C, located off of [address] Free 24/7 valet parking   You will have your sugar test next visit.  Please do not eat or drink anything after midnight the night before you come, not even water.  You will be here for at least two hours.  Please make an appointment online for the bloodwork at [email or web address] for 8:00am (or as close to this as possible). Make sure you select the [facility] service center.   CLASSES: Go to [email or web address] to register for classes (childbirth, breastfeeding, waterbirth, infant CPR, daddy bootcamp, etc.)  Call the office [phone]) or go to [facility] if: You begin to have strong, frequent contractions Your water breaks.  Sometimes it is a big gush of fluid, sometimes it is just a trickle that keeps getting your panties wet or running down your legs You have vaginal bleeding.  It is normal to have a small amount of spotting if your cervix was checked.  You don't feel your baby moving like normal.  If you don't, get you something to eat and drink and lay down and focus on feeling your baby move.   If your baby is still not moving like normal, you should call the office or go to [facility].  Call the office [phone]) or go to [facility] hospital for these signs of pre-eclampsia: Severe headache that does not go away with Tylenol Visual changes- seeing spots, double, blurred vision Pain under your right breast or upper  abdomen that does not go away with Tums or heartburn medicine Nausea and/or vomiting Severe swelling in your hands, feet, and face    [facility] Pediatricians/Family Doctors [facility]): [address] Colette Ribas, [phone]           [facility]: [address], [phone]                [facility]): [address], [phone] (call to ask if accepting patients) [facility]: [address], [place], [phone]    [facility] Pediatricians/Family Doctors [facility]): [address], [phone] [facility]: [address] [place], [phone] [facility]: [address], [phone]  [facility] Doctors  [facility]): [phone] [facility]: [address], [phone]   [facility] Doctors [facility]: [address] [phone]  [facility] Doctors  [facility]: [phone], [phone]  Home Blood Pressure Monitoring for Patients   Your provider has recommended that you check your blood pressure (BP) at least once a week at home. If you do not have a blood pressure cuff at home, one will be provided for you. Contact your provider if you have not received your monitor within 1 week.   Helpful Tips for Accurate Home Blood Pressure Checks  Don't smoke, exercise, or drink  caffeine 30 minutes before checking your BP Use the restroom before checking your BP (a full bladder can raise your pressure) Relax in a comfortable upright chair Feet on the ground Left arm resting comfortably on a flat surface at the level of your heart Legs uncrossed Back supported Sit quietly and don't talk Place the cuff on your bare arm Adjust snuggly, so that only two fingertips can fit between your skin and the top of the cuff Check 2  readings separated by at least one minute Keep a log of your BP readings For a visual, please reference this diagram: http://ccnc.care/bpdiagram  Provider Name: [facility]     Phone: [phone]  Zone 1: ALL CLEAR  Continue to monitor your symptoms:  BP reading is less than 140 (top number) or less than 90 (bottom number)  No right upper stomach pain No headaches or seeing spots No feeling nauseated or throwing up No swelling in face and hands  Zone 2: CAUTION Call your doctor's office for any of the following:  BP reading is greater than 140 (top number) or greater than 90 (bottom number)  Stomach pain under your ribs in the middle or right side Headaches or seeing spots Feeling nauseated or throwing up Swelling in face and hands  Zone 3: EMERGENCY  Seek immediate medical care if you have any of the following:  BP reading is greater than160 (top number) or greater than 110 (bottom number) Severe headaches not improving with Tylenol Serious difficulty catching your breath Any worsening symptoms from Zone 2   Second Trimester of Pregnancy The second trimester is from week 13 through week 28, months 4 through 6. The second trimester is often a time when you feel your best. Your body has also adjusted to being pregnant, and you begin to feel better physically. Usually, morning sickness has lessened or quit completely, you may have more energy, and you may have an increase in appetite. The second trimester is also a time when the fetus is growing rapidly. At the end of the sixth month, the fetus is about 9 inches long and weighs about 1 pounds. You will likely begin to feel the baby move (quickening) between 18 and 20 weeks of the pregnancy. BODY CHANGES Your body goes through many changes during pregnancy. The changes vary from woman to woman.  Your weight will continue to increase. You will notice your lower abdomen bulging out. You may begin to get stretch marks on your  hips, abdomen, and breasts. You may develop headaches that can be relieved by medicines approved by your health care provider. You may urinate more often because the fetus is pressing on your bladder. You may develop or continue to have heartburn as a result of your pregnancy. You may develop constipation because certain hormones are causing the muscles that push waste through your intestines to slow down. You may develop hemorrhoids or swollen, bulging veins (varicose veins). You may have back pain because of the weight gain and pregnancy hormones relaxing your joints between the bones in your pelvis and as a result of a shift in weight and the muscles that support your balance. Your breasts will continue to grow and be tender. Your gums may bleed and may be sensitive to brushing and flossing. Dark spots or blotches (chloasma, mask of pregnancy) may develop on your face. This will likely fade after the baby is born. A dark line from your belly button to the pubic area (linea nigra) may appear. This  will likely fade after the baby is born. You may have changes in your hair. These can include thickening of your hair, rapid growth, and changes in texture. Some women also have hair loss during or after pregnancy, or hair that feels dry or thin. Your hair will most likely return to normal after your baby is born. WHAT TO EXPECT AT YOUR PRENATAL VISITS During a routine prenatal visit: You will be weighed to make sure you and the fetus are growing normally. Your blood pressure will be taken. Your abdomen will be measured to track your baby's growth. The fetal heartbeat will be listened to. Any test results from the previous visit will be discussed. Your health care provider may ask you: How you are feeling. If you are feeling the baby move. If you have had any abnormal symptoms, such as leaking fluid, bleeding, severe headaches, or abdominal cramping. If you have any questions. Other tests that may  be performed during your second trimester include: Blood tests that check for: Low iron levels (anemia). Gestational diabetes (between 24 and 28 weeks). Rh antibodies. Urine tests to check for infections, diabetes, or protein in the urine. An ultrasound to confirm the proper growth and development of the baby. An amniocentesis to check for possible genetic problems. Fetal screens for spina bifida and Down syndrome. HOME CARE INSTRUCTIONS  Avoid all smoking, herbs, alcohol, and unprescribed drugs. These chemicals affect the formation and growth of the baby. Follow your health care provider's instructions regarding medicine use. There are medicines that are either safe or unsafe to take during pregnancy. Exercise only as directed by your health care provider. Experiencing uterine cramps is a good sign to stop exercising. Continue to eat regular, healthy meals. Wear a good support bra for breast tenderness. Do not use hot tubs, steam rooms, or saunas. Wear your seat belt at all times when driving. Avoid raw meat, uncooked cheese, cat litter boxes, and soil used by cats. These carry germs that can cause birth defects in the baby. Take your prenatal vitamins. Try taking a stool softener (if your health care provider approves) if you develop constipation. Eat more high-fiber foods, such as fresh vegetables or fruit and whole grains. Drink plenty of fluids to keep your urine clear or pale yellow. Take warm sitz baths to soothe any pain or discomfort caused by hemorrhoids. Use hemorrhoid cream if your health care provider approves. If you develop varicose veins, wear support hose. Elevate your feet for 15 minutes, 3-4 times a day. Limit salt in your diet. Avoid heavy lifting, wear low heel shoes, and practice good posture. Rest with your legs elevated if you have leg cramps or low back pain. Visit your dentist if you have not gone yet during your pregnancy. Use a soft toothbrush to brush your teeth  and be gentle when you floss. A sexual relationship may be continued unless your health care provider directs you otherwise. Continue to go to all your prenatal visits as directed by your health care provider. SEEK MEDICAL CARE IF:  You have dizziness. You have mild pelvic cramps, pelvic pressure, or nagging pain in the abdominal area. You have persistent nausea, vomiting, or diarrhea. You have a bad smelling vaginal discharge. You have pain with urination. SEEK IMMEDIATE MEDICAL CARE IF:  You have a fever. You are leaking fluid from your vagina. You have spotting or bleeding from your vagina. You have severe abdominal cramping or pain. You have rapid weight gain or loss. You have shortness of   breath with chest pain. You notice sudden or extreme swelling of your face, hands, ankles, feet, or legs. You have not felt your baby move in over an hour. You have severe headaches that do not go away with medicine. You have vision changes. Document Released: 11/18/2001 Document Revised: 11/29/2013 Document Reviewed: 01/25/2013 [facility] Patient Information 2015 [place], [place]. This information is not intended to replace advice given to you by your health care provider. Make sure you discuss any questions you have with your health care provider.   Considering Waterbirth? Guide for patients at Center for [employer] [facility]) Why consider waterbirth? Gentle birth for babies  Less pain medicine used in labor  May allow for passive descent/less pushing  May reduce perineal tears  More mobility and instinctive maternal position changes  Increased maternal relaxation   Is waterbirth safe? What are the risks of infection, drowning or other complications? Infection:  Very low risk (3.7 % for tub vs 4.8% for bed)  7 in 8000 waterbirths with documented infection  Poorly cleaned equipment most common cause  Slightly lower group B strep transmission rate  Drowning  Maternal:  Very low risk   Related to seizures or fainting  Newborn:  Very low risk. No evidence of increased risk of respiratory problems in multiple large studies  Physiological protection from breathing under water  Avoid underwater birth if there are any fetal complications  Once baby's head is out of the water, keep it out.  Birth complication  Some reports of cord trauma, but risk decreased by bringing baby to surface gradually  No evidence of increased risk of shoulder dystocia. Mothers can usually change positions faster in water than in a bed, possibly aiding the maneuvers to free the shoulder.   There are 2 things you MUST do to have a waterbirth with [facility]: Attend a waterbirth class at [employer] [facility] at [facility]   3rd Wednesday of every month from 7-9 pm (virtual during COVID) [employer] at [email or web address] or [email or web address] or by calling [phone] Bring [place] the certificate from the class to your prenatal appointment or send via MyChart Meet with a midwife at 36 weeks* to see if you can still plan a waterbirth and to sign the consent.   *We also recommend that you schedule as many of your prenatal visits with a midwife as possible.    Helpful information: You may want to bring a bathing suit top to the hospital to wear during labor but this is optional.  All other supplies are provided by the hospital. Please arrive at the hospital with signs of active labor, and do not wait at home until late in labor. It takes 45 min- 2 hours for COVID testing, fetal monitoring, and check in to your room to take place, plus transport and filling of the waterbirth tub.    Things that would prevent you from having a waterbirth: Unknown or Positive COVID-19 diagnosis upon admission to hospital* Premature, <37wks  Previous cesarean birth  Presence of thick meconium-stained fluid  Multiple gestation (Twins, triplets, etc.)  Uncontrolled diabetes or gestational diabetes  requiring medication  Hypertension diagnosed in pregnancy or preexisting hypertension (gestational hypertension, preeclampsia, or chronic hypertension) Fetal growth restriction (your baby measures less than 10th percentile on ultrasound) Heavy vaginal bleeding  Non-reassuring fetal heart rate  Active infection (MRSA, etc.). Group B Strep is NOT a contraindication for waterbirth.  If your labor has to be induced and induction method requires continuous monitoring of the baby's  heart rate  Other risks/issues identified by your obstetrical provider   Please remember that birth is unpredictable. Under certain unforeseeable circumstances your provider may advise against giving birth in the tub. These decisions will be made on a case-by-case basis and with the safety of you and your baby as our highest priority.   *Please remember that in order to have a waterbirth, you must test Negative to COVID-19 upon admission to the hospital.  Updated 03/18/21

## 2021-07-08 NOTE — Progress Notes (Signed)
    LOW-RISK PREGNANCY VISIT Patient name: Anna Guzman MRN [id number]  Date of birth: Feb 17, 1991 Chief Complaint:   Routine Prenatal Visit  History of Present Illness:   Anna Guzman is a 30 y.o. G24P0000 female at [redacted]w[redacted]d with an Estimated Date of Delivery: 10/19/21 being seen today for ongoing management of a low-risk pregnancy.   Today she reports no complaints. Contractions: Not present. Vag. Bleeding: None.  Movement: Present. denies leaking of fluid.  Depression screen [facility] 2/9 03/25/2021 10/06/2018 12/28/2017 10/01/2016  Decreased Interest 0 0 0 0  Down, Depressed, Hopeless 0 0 0 0  PHQ - 2 Score 0 0 0 0  Altered sleeping 0 0 - -  Tired, decreased energy 1 0 - -  Change in appetite 0 0 - -  Feeling bad or failure about yourself  0 0 - -  Trouble concentrating 0 0 - -  Moving slowly or fidgety/restless 0 0 - -  Suicidal thoughts 0 0 - -  PHQ-9 Score 1 0 - -     GAD 7 : Generalized Anxiety Score 03/25/2021  Nervous, Anxious, on Edge 0  Control/stop worrying 0  Worry too much - different things 0  Trouble relaxing 0  Restless 0  Easily annoyed or irritable 0  Afraid - awful might happen 0  Total GAD 7 Score 0      Review of Systems:   Pertinent items are noted in HPI Denies abnormal vaginal discharge w/ itching/odor/irritation, headaches, visual changes, shortness of breath, chest pain, abdominal pain, severe nausea/vomiting, or problems with urination or bowel movements unless otherwise stated above. Pertinent History Reviewed:  Reviewed past medical,surgical, social, obstetrical and family history.  Reviewed problem list, medications and allergies. Physical Assessment:   Vitals:   07/08/21 0901  BP: 125/75  Pulse: (!) 113  Weight: 141 lb (64 kg)  Body mass index is 23.46 kg/m.        Physical Examination:   General appearance: Well appearing, and in no distress  Mental status: Alert, oriented to person, place, and time  Skin: Warm & dry  Cardiovascular:  Normal heart rate noted  Respiratory: Normal respiratory effort, no distress  Abdomen: Soft, gravid, nontender  Pelvic: Cervical exam deferred         Extremities: Edema: None  Fetal Status: Fetal Heart Rate (bpm): 153 Fundal Height: 25 cm Movement: Present    Chaperone: N/A   No results found for this or any previous visit (from the past 24 hour(s)).  Assessment & Plan:  1) Low-risk pregnancy G1P0000 at [redacted]w[redacted]d with an Estimated Date of Delivery: 10/19/21    Meds: No orders of the defined types were placed in this encounter.  Labs/procedures today: none  Plan:  Continue routine obstetrical care  Next visit: prefers will be in person for pn2     Reviewed: Preterm labor symptoms and general obstetric precautions including but not limited to vaginal bleeding, contractions, leaking of fluid and fetal movement were reviewed in detail with the patient.  All questions were answered. Does have home bp cuff. Office bp cuff given: not applicable. Check bp weekly, let us know if consistently >140 and/or >90.  Follow-up: Return in about 3 weeks (around 07/29/2021) for LROB, PN2, CNM, in person.  No future appointments.  No orders of the defined types were placed in this encounter.  Cheral Marker CNM, [facility] 07/08/2021 9:28 AM

## 2021-08-01 ENCOUNTER — Other Ambulatory Visit: Payer: BC Managed Care – PPO

## 2021-08-01 ENCOUNTER — Other Ambulatory Visit: Payer: Self-pay

## 2021-08-01 ENCOUNTER — Ambulatory Visit (INDEPENDENT_AMBULATORY_CARE_PROVIDER_SITE_OTHER): Payer: BC Managed Care – PPO | Admitting: Advanced Practice Midwife

## 2021-08-01 VITALS — BP 132/77 | HR 112 | Wt 144.0 lb

## 2021-08-01 DIAGNOSIS — Z3A28 28 weeks gestation of pregnancy: Secondary | ICD-10-CM

## 2021-08-01 DIAGNOSIS — O24410 Gestational diabetes mellitus in pregnancy, diet controlled: Secondary | ICD-10-CM

## 2021-08-01 DIAGNOSIS — E039 Hypothyroidism, unspecified: Secondary | ICD-10-CM

## 2021-08-01 DIAGNOSIS — Z23 Encounter for immunization: Secondary | ICD-10-CM | POA: Diagnosis not present

## 2021-08-01 DIAGNOSIS — Z3403 Encounter for supervision of normal first pregnancy, third trimester: Secondary | ICD-10-CM

## 2021-08-01 NOTE — Patient Instructions (Addendum)
Anna Guzman, I greatly value your feedback.  If you receive a survey following your visit with [place] today, we appreciate you taking the time to fill it out.  Thanks, [name], CNM   [facility] HAS MOVED!!! It is now [facility] at [facility] ([address] [place], [place] [address]) Entrance located off of [address] Free 24/7 valet parking   Go to [email or web address] to register for FREE online childbirth classes    Call the office [phone]) or go to [facility] if: You begin to have strong, frequent contractions Your water breaks.  Sometimes it is a big gush of fluid, sometimes it is just a trickle that keeps getting your panties wet or running down your legs You have vaginal bleeding.  It is normal to have a small amount of spotting if your cervix was checked.  You don't feel your baby moving like normal.  If you don't, get you something to eat and drink and lay down and focus on feeling your baby move.  You should feel at least 10 movements in 2 hours.  If you don't, you should call the office or go to [facility].    Tdap Vaccine It is recommended that you get the Tdap vaccine during the third trimester of EACH pregnancy to help protect your baby from getting pertussis (whooping cough) 27-36 weeks is the BEST time to do this so that you can pass the protection on to your baby. During pregnancy is better than after pregnancy, but if you are unable to get it during pregnancy it will be offered at the hospital.  You will be offered this vaccine in the office after 27 weeks. If you do not have health insurance, you can get this vaccine at the health department or your family doctor Everyone who will be around your baby should also be up-to-date on their vaccines. Adults (who are not pregnant) only need 1 dose of Tdap during adulthood.   Third Trimester of Pregnancy The third trimester is from week 29 through week 42, months 7 through 9. The third  trimester is a time when the fetus is growing rapidly. At the end of the ninth month, the fetus is about 20 inches in length and weighs 6-10 pounds.  BODY CHANGES Your body goes through many changes during pregnancy. The changes vary from woman to woman.  Your weight will continue to increase. You can expect to gain 25-35 pounds (11-16 kg) by the end of the pregnancy. You may begin to get stretch marks on your hips, abdomen, and breasts. You may urinate more often because the fetus is moving lower into your pelvis and pressing on your bladder. You may develop or continue to have heartburn as a result of your pregnancy. You may develop constipation because certain hormones are causing the muscles that push waste through your intestines to slow down. You may develop hemorrhoids or swollen, bulging veins (varicose veins). You may have pelvic pain because of the weight gain and pregnancy hormones relaxing your joints between the bones in your pelvis. Backaches may result from overexertion of the muscles supporting your posture. You may have changes in your hair. These can include thickening of your hair, rapid growth, and changes in texture. Some women also have hair loss during or after pregnancy, or hair that feels dry or thin. Your hair will most likely return to normal after your baby is born. Your breasts will continue to grow and be tender. A yellow  discharge may leak from your breasts called colostrum. Your belly button may stick out. You may feel short of breath because of your expanding uterus. You may notice the fetus "dropping," or moving lower in your abdomen. You may have a bloody mucus discharge. This usually occurs a few days to a week before labor begins. Your cervix becomes thin and soft (effaced) near your due date. WHAT TO EXPECT AT YOUR PRENATAL EXAMS  You will have prenatal exams every 2 weeks until week 36. Then, you will have weekly prenatal exams. During a routine prenatal  visit: You will be weighed to make sure you and the fetus are growing normally. Your blood pressure is taken. Your abdomen will be measured to track your baby's growth. The fetal heartbeat will be listened to. Any test results from the previous visit will be discussed. You may have a cervical check near your due date to see if you have effaced. At around 36 weeks, your caregiver will check your cervix. At the same time, your caregiver will also perform a test on the secretions of the vaginal tissue. This test is to determine if a type of bacteria, Group B streptococcus, is present. Your caregiver will explain this further. Your caregiver may ask you: What your birth plan is. How you are feeling. If you are feeling the baby move. If you have had any abnormal symptoms, such as leaking fluid, bleeding, severe headaches, or abdominal cramping. If you have any questions. Other tests or screenings that may be performed during your third trimester include: Blood tests that check for low iron levels (anemia). Fetal testing to check the health, activity level, and growth of the fetus. Testing is done if you have certain medical conditions or if there are problems during the pregnancy. FALSE LABOR You may feel small, irregular contractions that eventually go away. These are called Braxton Hicks contractions, or false labor. Contractions may last for hours, days, or even weeks before true labor sets in. If contractions come at regular intervals, intensify, or become painful, it is best to be seen by your caregiver.  SIGNS OF LABOR  Menstrual-like cramps. Contractions that are 5 minutes apart or less. Contractions that start on the top of the uterus and spread down to the lower abdomen and back. A sense of increased pelvic pressure or back pain. A watery or bloody mucus discharge that comes from the vagina. If you have any of these signs before the 37th week of pregnancy, call your caregiver right away.  You need to go to the hospital to get checked immediately. HOME CARE INSTRUCTIONS  Avoid all smoking, herbs, alcohol, and unprescribed drugs. These chemicals affect the formation and growth of the baby. Follow your caregiver's instructions regarding medicine use. There are medicines that are either safe or unsafe to take during pregnancy. Exercise only as directed by your caregiver. Experiencing uterine cramps is a good sign to stop exercising. Continue to eat regular, healthy meals. Wear a good support bra for breast tenderness. Do not use hot tubs, steam rooms, or saunas. Wear your seat belt at all times when driving. Avoid raw meat, uncooked cheese, cat litter boxes, and soil used by cats. These carry germs that can cause birth defects in the baby. Take your prenatal vitamins. Try taking a stool softener (if your caregiver approves) if you develop constipation. Eat more high-fiber foods, such as fresh vegetables or fruit and whole grains. Drink plenty of fluids to keep your urine clear or pale yellow. Take  warm sitz baths to soothe any pain or discomfort caused by hemorrhoids. Use hemorrhoid cream if your caregiver approves. If you develop varicose veins, wear support hose. Elevate your feet for 15 minutes, 3-4 times a day. Limit salt in your diet. Avoid heavy lifting, wear low heal shoes, and practice good posture. Rest a lot with your legs elevated if you have leg cramps or low back pain. Visit your dentist if you have not gone during your pregnancy. Use a soft toothbrush to brush your teeth and be gentle when you floss. A sexual relationship may be continued unless your caregiver directs you otherwise. Do not travel far distances unless it is absolutely necessary and only with the approval of your caregiver. Take prenatal classes to understand, practice, and ask questions about the labor and delivery. Make a trial run to the hospital. Pack your hospital bag. Prepare the baby's  nursery. Continue to go to all your prenatal visits as directed by your caregiver. SEEK MEDICAL CARE IF: You are unsure if you are in labor or if your water has broken. You have dizziness. You have mild pelvic cramps, pelvic pressure, or nagging pain in your abdominal area. You have persistent nausea, vomiting, or diarrhea. You have a bad smelling vaginal discharge. You have pain with urination. SEEK IMMEDIATE MEDICAL CARE IF:  You have a fever. You are leaking fluid from your vagina. You have spotting or bleeding from your vagina. You have severe abdominal cramping or pain. You have rapid weight loss or gain. You have shortness of breath with chest pain. You notice sudden or extreme swelling of your face, hands, ankles, feet, or legs. You have not felt your baby move in over an hour. You have severe headaches that do not go away with medicine. You have vision changes. Document Released: 11/18/2001 Document Revised: 11/29/2013 Document Reviewed: 01/25/2013 [facility] Patient Information 2015 [place], [place]. This information is not intended to replace advice given to you by your health care provider. Make sure you discuss any questions you have with your health care provider.   Safe Medications in Pregnancy   Acne: Benzoyl Peroxide Salicylic Acid  Backache/Headache: Tylenol: 2 regular strength every 4 hours OR              2 Extra strength every 6 hours  Colds/Coughs/Allergies: Benadryl (alcohol free) 25 mg every 6 hours as needed Breath right strips Claritin Cepacol throat lozenges Chloraseptic throat spray Cold-Eeze- up to three times per day Cough drops, alcohol free Flonase (by prescription only) Guaifenesin Mucinex Robitussin DM (plain only, alcohol free) Saline nasal spray/drops Sudafed (pseudoephedrine) & Actifed ** use only after [redacted] weeks gestation and if you do not have high blood pressure Tylenol Vicks Vaporub Zinc lozenges Zyrtec    Constipation: Colace Ducolax suppositories Fleet enema Glycerin suppositories Metamucil Milk of magnesia Miralax Senokot Smooth move tea  Diarrhea: Kaopectate Imodium A-D  *NO pepto Bismol  Hemorrhoids: Anusol Anusol HC Preparation H Tucks  Indigestion: Tums Maalox Mylanta Zantac  Pepcid  Insomnia: Benadryl (alcohol free) 25mg  every 6 hours as needed Tylenol PM Unisom, no Gelcaps  Leg Cramps: Tums MagGel  Nausea/Vomiting:  Bonine Dramamine Emetrol Ginger extract Sea bands Meclizine  Nausea medication to take during pregnancy:  Unisom (doxylamine succinate 25 mg tablets) Take one tablet daily at bedtime. If symptoms are not adequately controlled, the dose can be increased to a maximum recommended dose of two tablets daily (1/2 tablet in the morning, 1/2 tablet mid-afternoon and one at bedtime). Vitamin B6 100mg   tablets. Take one tablet twice a day (up to 200 mg per day).  Skin Rashes: Aveeno products Benadryl cream or 25mg  every 6 hours as needed Calamine Lotion 1% cortisone cream  Yeast infection: Gyne-lotrimin 7 Monistat 7   **If taking multiple medications, please check labels to avoid duplicating the same active ingredients **take medication as directed on the label ** Do not exceed 4000 mg of tylenol in 24 hours **Do not take medications that contain aspirin or ibuprofen

## 2021-08-01 NOTE — Progress Notes (Signed)
   LOW-RISK PREGNANCY VISIT Patient name: Anna Guzman MRN [id number]  Date of birth: 1991-01-29 Chief Complaint:   Routine Prenatal Visit  History of Present Illness:   Anna Guzman is a 30 y.o. G32P0000 female at [redacted]w[redacted]d with an Estimated Date of Delivery: 10/19/21 being seen today for ongoing management of a low-risk pregnancy.  Today she reports Heartburn, hasn't tried anything yet. . Contractions: Not present. Vag. Bleeding: None.  Movement: Present. denies leaking of fluid. Review of Systems:   Pertinent items are noted in HPI Denies abnormal vaginal discharge w/ itching/odor/irritation, headaches, visual changes, shortness of breath, chest pain, abdominal pain, severe nausea/vomiting, or problems with urination or bowel movements unless otherwise stated above. Pertinent History Reviewed:  Reviewed past medical,surgical, social, obstetrical and family history.  Reviewed problem list, medications and allergies. Physical Assessment:   Vitals:   08/01/21 0953  BP: 132/77  Pulse: (!) 112  Weight: 144 lb (65.3 kg)  Body mass index is 23.96 kg/m.        Physical Examination:   General appearance: Well appearing, and in no distress  Mental status: Alert, oriented to person, place, and time  Skin: Warm & dry  Cardiovascular: Normal heart rate noted  Respiratory: Normal respiratory effort, no distress  Abdomen: Soft, gravid, nontender  Pelvic: Cervical exam deferred         Extremities: Edema: None  Fetal Status: Fetal Heart Rate (bpm): 150 Fundal Height: 28 cm Movement: Present    Chaperone: n/a    No results found for this or any previous visit (from the past 24 hour(s)).  Assessment & Plan:  1) Low-risk pregnancy G1P0000 at [redacted]w[redacted]d with an Estimated Date of Delivery: 10/19/21   2) Hypothyroidism, check TSH today   Meds: No orders of the defined types were placed in this encounter.  Labs/procedures today: PN2  Plan:  Continue routine obstetrical care  Next visit: prefers  in person    Reviewed: Preterm labor symptoms and general obstetric precautions including but not limited to vaginal bleeding, contractions, leaking of fluid and fetal movement were reviewed in detail with the patient.  All questions were answered. Has home bp cuff.. Check bp weekly, let us know if >140/90.   Follow-up: Return in about 3 weeks (around 08/22/2021) for LROB.  Orders Placed This Encounter  Procedures  . Tdap vaccine greater than or equal to 7yo IM  . TSH   Jacklyn Shell DNP, CNM 08/01/2021 10:27 AM

## 2021-08-02 ENCOUNTER — Other Ambulatory Visit: Payer: Self-pay

## 2021-08-02 DIAGNOSIS — O24410 Gestational diabetes mellitus in pregnancy, diet controlled: Secondary | ICD-10-CM

## 2021-08-02 LAB — CBC
Hematocrit: 32.7 % — ABNORMAL LOW (ref 34.0–46.6)
Hemoglobin: 11.1 g/dL (ref 11.1–15.9)
MCH: 29.3 pg (ref 26.6–33.0)
MCHC: 33.9 g/dL (ref 31.5–35.7)
MCV: 86 fL (ref 79–97)
Platelets: 297 10*3/uL (ref 150–450)
RBC: 3.79 x10E6/uL (ref 3.77–5.28)
RDW: 12 % (ref 11.7–15.4)
WBC: 9.6 10*3/uL (ref 3.4–10.8)

## 2021-08-02 LAB — TSH: TSH: 1.24 u[IU]/mL (ref 0.450–4.500)

## 2021-08-02 LAB — HIV ANTIBODY (ROUTINE TESTING W REFLEX): HIV Screen 4th Generation wRfx: NONREACTIVE

## 2021-08-02 LAB — GLUCOSE TOLERANCE, 2 HOURS W/ 1HR
Glucose, 1 hour: 181 mg/dL — ABNORMAL HIGH (ref 65–179)
Glucose, 2 hour: 113 mg/dL (ref 65–152)
Glucose, Fasting: 92 mg/dL — ABNORMAL HIGH (ref 65–91)

## 2021-08-02 LAB — RPR: RPR Ser Ql: NONREACTIVE

## 2021-08-02 LAB — ANTIBODY SCREEN: Antibody Screen: NEGATIVE

## 2021-08-02 MED ORDER — GLUCOSE BLOOD VI STRP: ORAL_STRIP | 99 refills | Status: DC

## 2021-08-02 MED ORDER — ONETOUCH ULTRASOFT LANCETS MISC: 99 refills | Status: DC

## 2021-08-02 MED ORDER — ACCU-CHEK GUIDE ME W/DEVICE KIT: 1.0000 | PACK | Freq: Four times a day (QID) | 0 refills | Status: DC

## 2021-08-05 ENCOUNTER — Other Ambulatory Visit: Payer: Self-pay | Admitting: Women's Health

## 2021-08-05 DIAGNOSIS — O24410 Gestational diabetes mellitus in pregnancy, diet controlled: Secondary | ICD-10-CM | POA: Insufficient documentation

## 2021-08-05 MED ORDER — ASPIRIN 81 MG PO TBEC: 81.0000 mg | DELAYED_RELEASE_TABLET | Freq: Every day | ORAL | 3 refills | Status: DC

## 2021-08-05 NOTE — Addendum Note (Signed)
Addended by: Leilani Able, Maryon Kemnitz A on: 08/05/2021 12:43 PM   Modules accepted: Orders

## 2021-08-13 ENCOUNTER — Other Ambulatory Visit: Payer: Self-pay

## 2021-08-21 ENCOUNTER — Encounter: Payer: Self-pay | Admitting: Women's Health

## 2021-08-21 ENCOUNTER — Ambulatory Visit (INDEPENDENT_AMBULATORY_CARE_PROVIDER_SITE_OTHER): Payer: BC Managed Care – PPO | Admitting: Women's Health

## 2021-08-21 ENCOUNTER — Other Ambulatory Visit: Payer: Self-pay

## 2021-08-21 VITALS — BP 115/60 | HR 96 | Wt 145.0 lb

## 2021-08-21 DIAGNOSIS — Z3A31 31 weeks gestation of pregnancy: Secondary | ICD-10-CM

## 2021-08-21 DIAGNOSIS — O0993 Supervision of high risk pregnancy, unspecified, third trimester: Secondary | ICD-10-CM

## 2021-08-21 DIAGNOSIS — O24410 Gestational diabetes mellitus in pregnancy, diet controlled: Secondary | ICD-10-CM

## 2021-08-21 MED ORDER — GLUCOSE BLOOD VI STRP: ORAL_STRIP | 12 refills | Status: DC

## 2021-08-21 NOTE — Patient Instructions (Signed)
Anna Guzman, thank you for choosing our office today! We appreciate the opportunity to meet your healthcare needs. You may receive a short survey by mail, e-mail, or through [employer]. If you are happy with your care we would appreciate if you could take just a few minutes to complete the survey questions. We read all of your comments and take your feedback very seriously. Thank you again for choosing our office.  Center for [employer] Team at [facility]  [facility] at [facility] ([address] [place], [place] [address]) Entrance C, located off of [address] Free 24/7 valet parking   CLASSES: Go to [email or web address] to register for classes (childbirth, breastfeeding, waterbirth, infant CPR, daddy bootcamp, etc.)  Call the office [phone]) or go to [facility] if: You begin to have strong, frequent contractions Your water breaks.  Sometimes it is a big gush of fluid, sometimes it is just a trickle that keeps getting your panties wet or running down your legs You have vaginal bleeding.  It is normal to have a small amount of spotting if your cervix was checked.  You don't feel your baby moving like normal.  If you don't, get you something to eat and drink and lay down and focus on feeling your baby move.   If your baby is still not moving like normal, you should call the office or go to [facility].  Call the office [phone]) or go to [facility] hospital for these signs of pre-eclampsia: Severe headache that does not go away with Tylenol Visual changes- seeing spots, double, blurred vision Pain under your right breast or upper abdomen that does not go away with Tums or heartburn medicine Nausea and/or vomiting Severe swelling in your hands, feet, and face   Tdap Vaccine It is recommended that you get the Tdap vaccine during the third trimester of EACH pregnancy to help protect your baby from getting pertussis (whooping cough) 27-36 weeks is the BEST time to do  this so that you can pass the protection on to your baby. During pregnancy is better than after pregnancy, but if you are unable to get it during pregnancy it will be offered at the hospital.  You can get this vaccine with [place], at the health department, your family doctor, or some local pharmacies Everyone who will be around your baby should also be up-to-date on their vaccines before the baby comes. Adults (who are not pregnant) only need 1 dose of Tdap during adulthood.   [facility] Pediatricians/Family Doctors [facility]): [address] Colette Ribas, [phone]           [facility]: [address], [phone]                [facility]): [address], [phone] (call to ask if accepting patients) [facility] Department: [address], [place], [phone]    [facility] Pediatricians/Family Doctors [facility]): [phone], [phone] [facility]: [address] [place], [phone] [facility]: [address], [phone]  [facility] Doctors  [facility]): [phone] [facility]: [address] [phone]   [facility] Doctors [facility]: [address], [phone]  [facility] Family Doctors  [facility]: [phone], [phone]  Home Blood Pressure Monitoring for Patients   Your provider has recommended that you check your  blood pressure (BP) at least once a week at home. If you do not have a blood pressure cuff at home, one will be provided for you. Contact your provider if you have not received your monitor within 1 week.   Helpful Tips for Accurate Home Blood Pressure Checks  Don't smoke, exercise, or drink caffeine 30 minutes before checking your BP Use the restroom before checking your BP (a full bladder can raise your  pressure) Relax in a comfortable upright chair Feet on the ground Left arm resting comfortably on a flat surface at the level of your heart Legs uncrossed Back supported Sit quietly and don't talk Place the cuff on your bare arm Adjust snuggly, so that only two fingertips can fit between your skin and the top of the cuff Check 2 readings separated by at least one minute Keep a log of your BP readings For a visual, please reference this diagram: http://ccnc.care/bpdiagram  Provider Name: [facility]     Phone: [phone]  Zone 1: ALL CLEAR  Continue to monitor your symptoms:  BP reading is less than 140 (top number) or less than 90 (bottom number)  No right upper stomach pain No headaches or seeing spots No feeling nauseated or throwing up No swelling in face and hands  Zone 2: CAUTION Call your doctor's office for any of the following:  BP reading is greater than 140 (top number) or greater than 90 (bottom number)  Stomach pain under your ribs in the middle or right side Headaches or seeing spots Feeling nauseated or throwing up Swelling in face and hands  Zone 3: EMERGENCY  Seek immediate medical care if you have any of the following:  BP reading is greater than160 (top number) or greater than 110 (bottom number) Severe headaches not improving with Tylenol Serious difficulty catching your breath Any worsening symptoms from Zone 2   Third Trimester of Pregnancy The third trimester is from week 29 through week 42, months 7 through 9. The third trimester is a time when the fetus is growing rapidly. At the end of the ninth month, the fetus is about 20 inches in length and weighs 6-10 pounds.  BODY CHANGES Your body goes through many changes during pregnancy. The changes vary from woman to woman.  Your weight will continue to increase. You can expect to gain 25-35 pounds (11-16 kg) by the end of the pregnancy. You may begin to get stretch marks on your hips, abdomen,  and breasts. You may urinate more often because the fetus is moving lower into your pelvis and pressing on your bladder. You may develop or continue to have heartburn as a result of your pregnancy. You may develop constipation because certain hormones are causing the muscles that push waste through your intestines to slow down. You may develop hemorrhoids or swollen, bulging veins (varicose veins). You may have pelvic pain because of the weight gain and pregnancy hormones relaxing your joints between the bones in your pelvis. Backaches may result from overexertion of the muscles supporting your posture. You may have changes in your hair. These can include thickening of your hair, rapid growth, and changes in texture. Some women also have hair loss during or after pregnancy, or hair that feels dry or thin. Your hair will most likely return to normal after your baby is born. Your breasts will continue to grow and be tender. A yellow discharge may leak from your breasts called colostrum. Your belly button may stick out. You may   feel short of breath because of your expanding uterus. You may notice the fetus "dropping," or moving lower in your abdomen. You may have a bloody mucus discharge. This usually occurs a few days to a week before labor begins. Your cervix becomes thin and soft (effaced) near your due date. WHAT TO EXPECT AT YOUR PRENATAL EXAMS  You will have prenatal exams every 2 weeks until week 36. Then, you will have weekly prenatal exams. During a routine prenatal visit: You will be weighed to make sure you and the fetus are growing normally. Your blood pressure is taken. Your abdomen will be measured to track your baby's growth. The fetal heartbeat will be listened to. Any test results from the previous visit will be discussed. You may have a cervical check near your due date to see if you have effaced. At around 36 weeks, your caregiver will check your cervix. At the same time, your  caregiver will also perform a test on the secretions of the vaginal tissue. This test is to determine if a type of bacteria, Group B streptococcus, is present. Your caregiver will explain this further. Your caregiver may ask you: What your birth plan is. How you are feeling. If you are feeling the baby move. If you have had any abnormal symptoms, such as leaking fluid, bleeding, severe headaches, or abdominal cramping. If you have any questions. Other tests or screenings that may be performed during your third trimester include: Blood tests that check for low iron levels (anemia). Fetal testing to check the health, activity level, and growth of the fetus. Testing is done if you have certain medical conditions or if there are problems during the pregnancy. FALSE LABOR You may feel small, irregular contractions that eventually go away. These are called Braxton Hicks contractions, or false labor. Contractions may last for hours, days, or even weeks before true labor sets in. If contractions come at regular intervals, intensify, or become painful, it is best to be seen by your caregiver.  SIGNS OF LABOR  Menstrual-like cramps. Contractions that are 5 minutes apart or less. Contractions that start on the top of the uterus and spread down to the lower abdomen and back. A sense of increased pelvic pressure or back pain. A watery or bloody mucus discharge that comes from the vagina. If you have any of these signs before the 37th week of pregnancy, call your caregiver right away. You need to go to the hospital to get checked immediately. HOME CARE INSTRUCTIONS  Avoid all smoking, herbs, alcohol, and unprescribed drugs. These chemicals affect the formation and growth of the baby. Follow your caregiver's instructions regarding medicine use. There are medicines that are either safe or unsafe to take during pregnancy. Exercise only as directed by your caregiver. Experiencing uterine cramps is a good sign to  stop exercising. Continue to eat regular, healthy meals. Wear a good support bra for breast tenderness. Do not use hot tubs, steam rooms, or saunas. Wear your seat belt at all times when driving. Avoid raw meat, uncooked cheese, cat litter boxes, and soil used by cats. These carry germs that can cause birth defects in the baby. Take your prenatal vitamins. Try taking a stool softener (if your caregiver approves) if you develop constipation. Eat more high-fiber foods, such as fresh vegetables or fruit and whole grains. Drink plenty of fluids to keep your urine clear or pale yellow. Take warm sitz baths to soothe any pain or discomfort caused by hemorrhoids. Use hemorrhoid cream if   your caregiver approves. If you develop varicose veins, wear support hose. Elevate your feet for 15 minutes, 3-4 times a day. Limit salt in your diet. Avoid heavy lifting, wear low heal shoes, and practice good posture. Rest a lot with your legs elevated if you have leg cramps or low back pain. Visit your dentist if you have not gone during your pregnancy. Use a soft toothbrush to brush your teeth and be gentle when you floss. A sexual relationship may be continued unless your caregiver directs you otherwise. Do not travel far distances unless it is absolutely necessary and only with the approval of your caregiver. Take prenatal classes to understand, practice, and ask questions about the labor and delivery. Make a trial run to the hospital. Pack your hospital bag. Prepare the baby's nursery. Continue to go to all your prenatal visits as directed by your caregiver. SEEK MEDICAL CARE IF: You are unsure if you are in labor or if your water has broken. You have dizziness. You have mild pelvic cramps, pelvic pressure, or nagging pain in your abdominal area. You have persistent nausea, vomiting, or diarrhea. You have a bad smelling vaginal discharge. You have pain with urination. SEEK IMMEDIATE MEDICAL CARE IF:  You  have a fever. You are leaking fluid from your vagina. You have spotting or bleeding from your vagina. You have severe abdominal cramping or pain. You have rapid weight loss or gain. You have shortness of breath with chest pain. You notice sudden or extreme swelling of your face, hands, ankles, feet, or legs. You have not felt your baby move in over an hour. You have severe headaches that do not go away with medicine. You have vision changes. Document Released: 11/18/2001 Document Revised: 11/29/2013 Document Reviewed: 01/25/2013 ExitCare Patient Information 2015 [employer]. This information is not intended to replace advice given to you by your health care provider. Make sure you discuss any questions you have with your health care provider.       

## 2021-08-21 NOTE — Progress Notes (Signed)
HIGH-RISK PREGNANCY VISIT Patient name: Anna Guzman MRN [id number]  Date of birth: 11/03/1991 Chief Complaint:   Routine Prenatal Visit  History of Present Illness:   Anna Guzman is a 30 y.o. G52P0000 female at [redacted]w[redacted]d with an Estimated Date of Delivery: 10/19/21 being seen today for ongoing management of a high-risk pregnancy complicated by diabetes mellitus A1DM, IUI pregnancy.    Today she reports  FBS 81-101 (majority <95), 2hr pp 84-137 (majority <120) . Contractions: Not present. Vag. Bleeding: None.  Movement: Present. denies leaking of fluid.   Depression screen [facility] 2/9 08/01/2021 03/25/2021 10/06/2018 12/28/2017 10/01/2016  Decreased Interest 0 0 0 0 0  Down, Depressed, Hopeless 0 0 0 0 0  PHQ - 2 Score 0 0 0 0 0  Altered sleeping 0 0 0 - -  Tired, decreased energy 1 1 0 - -  Change in appetite 0 0 0 - -  Feeling bad or failure about yourself  0 0 0 - -  Trouble concentrating 0 0 0 - -  Moving slowly or fidgety/restless 0 0 0 - -  Suicidal thoughts 0 0 0 - -  PHQ-9 Score 1 1 0 - -     GAD 7 : Generalized Anxiety Score 08/01/2021 03/25/2021  Nervous, Anxious, on Edge 0 0  Control/stop worrying 0 0  Worry too much - different things 0 0  Trouble relaxing 0 0  Restless 0 0  Easily annoyed or irritable 0 0  Afraid - awful might happen 0 0  Total GAD 7 Score 0 0     Review of Systems:   Pertinent items are noted in HPI Denies abnormal vaginal discharge w/ itching/odor/irritation, headaches, visual changes, shortness of breath, chest pain, abdominal pain, severe nausea/vomiting, or problems with urination or bowel movements unless otherwise stated above. Pertinent History Reviewed:  Reviewed past medical,surgical, social, obstetrical and family history.  Reviewed problem list, medications and allergies. Physical Assessment:   Vitals:   08/21/21 1554  BP: 115/60  Pulse: 96  Weight: 145 lb (65.8 kg)  Body mass index is 24.13 kg/m.           Physical Examination:    General appearance: alert, well appearing, and in no distress  Mental status: alert, oriented to person, place, and time  Skin: warm & dry   Extremities: Edema: None    Cardiovascular: normal heart rate noted  Respiratory: normal respiratory effort, no distress  Abdomen: gravid, soft, non-tender  Pelvic: Cervical exam deferred         Fetal Status: Fetal Heart Rate (bpm): 145 Fundal Height: 32 cm Movement: Present    Fetal Surveillance Testing today: doppler   Chaperone: N/A    No results found for this or any previous visit (from the past 24 hour(s)).  Assessment & Plan:  High-risk pregnancy: G1P0000 at [redacted]w[redacted]d with an Estimated Date of Delivery: 10/19/21   1) A1DM, stable  2) IUI pregnancy  Meds:  Meds ordered this encounter  Medications   glucose blood test strip    Sig: Use as instructed to check blood sugar 4 times daily    Dispense:  100 each    Refill:  12    Order Specific Question:   Supervising Provider    Answer:   Lazaro Arms [[id number]]     Labs/procedures today: none and declined flu shot  Treatment Plan:  Growth u/s 36wks    No antenatal testing as long as remains off meds  Deliver @ 39-40wks:____   Reviewed: Preterm labor symptoms and general obstetric precautions including but not limited to vaginal bleeding, contractions, leaking of fluid and fetal movement were reviewed in detail with the patient.  All questions were answered. Does have home bp cuff. Office bp cuff given: not applicable. Check bp weekly, let us know if consistently >140 and/or >90.  Follow-up: Return in about 2 weeks (around 09/04/2021) for HROB, MD or CNM, in person.   Future Appointments  Date Time Provider Department Center  09/05/2021  2:30 PM Lazaro Arms, MD [facility]    No orders of the defined types were placed in this encounter.  Cheral Marker CNM, [facility] 08/21/2021 4:30 PM

## 2021-08-28 ENCOUNTER — Ambulatory Visit: Payer: BC Managed Care – PPO

## 2021-09-05 ENCOUNTER — Encounter: Payer: Self-pay | Admitting: Obstetrics & Gynecology

## 2021-09-05 ENCOUNTER — Ambulatory Visit (INDEPENDENT_AMBULATORY_CARE_PROVIDER_SITE_OTHER): Payer: BC Managed Care – PPO | Admitting: Obstetrics & Gynecology

## 2021-09-05 ENCOUNTER — Other Ambulatory Visit: Payer: Self-pay

## 2021-09-05 VITALS — BP 122/81 | HR 100 | Wt 147.0 lb

## 2021-09-05 DIAGNOSIS — O24410 Gestational diabetes mellitus in pregnancy, diet controlled: Secondary | ICD-10-CM

## 2021-09-05 DIAGNOSIS — Z331 Pregnant state, incidental: Secondary | ICD-10-CM

## 2021-09-05 DIAGNOSIS — O0990 Supervision of high risk pregnancy, unspecified, unspecified trimester: Secondary | ICD-10-CM

## 2021-09-05 DIAGNOSIS — Z1389 Encounter for screening for other disorder: Secondary | ICD-10-CM

## 2021-09-05 DIAGNOSIS — Z3A33 33 weeks gestation of pregnancy: Secondary | ICD-10-CM

## 2021-09-05 LAB — POCT URINALYSIS DIPSTICK OB
Blood, UA: NEGATIVE
Glucose, UA: NEGATIVE
Nitrite, UA: NEGATIVE

## 2021-09-05 NOTE — Progress Notes (Signed)
HIGH-RISK PREGNANCY VISIT Patient name: Anna Guzman MRN [id number]  Date of birth: February 04, 1991 Chief Complaint:   Routine Prenatal Visit  History of Present Illness:   Anna Guzman is a 30 y.o. G53P0000 female at 102w5d with an Estimated Date of Delivery: 10/19/21 being seen today for ongoing management of a high-risk pregnancy complicated by gestational diabetes.    Today she reports no complaints. Contractions: Not present.  .  Movement: Present. denies leaking of fluid.   Depression screen [facility] 2/9 08/01/2021 03/25/2021 10/06/2018 12/28/2017 10/01/2016  Decreased Interest 0 0 0 0 0  Down, Depressed, Hopeless 0 0 0 0 0  PHQ - 2 Score 0 0 0 0 0  Altered sleeping 0 0 0 - -  Tired, decreased energy 1 1 0 - -  Change in appetite 0 0 0 - -  Feeling bad or failure about yourself  0 0 0 - -  Trouble concentrating 0 0 0 - -  Moving slowly or fidgety/restless 0 0 0 - -  Suicidal thoughts 0 0 0 - -  PHQ-9 Score 1 1 0 - -     GAD 7 : Generalized Anxiety Score 08/01/2021 03/25/2021  Nervous, Anxious, on Edge 0 0  Control/stop worrying 0 0  Worry too much - different things 0 0  Trouble relaxing 0 0  Restless 0 0  Easily annoyed or irritable 0 0  Afraid - awful might happen 0 0  Total GAD 7 Score 0 0     Review of Systems:   Pertinent items are noted in HPI Denies abnormal vaginal discharge w/ itching/odor/irritation, headaches, visual changes, shortness of breath, chest pain, abdominal pain, severe nausea/vomiting, or problems with urination or bowel movements unless otherwise stated above. Pertinent History Reviewed:  Reviewed past medical,surgical, social, obstetrical and family history.  Reviewed problem list, medications and allergies. Physical Assessment:   Vitals:   09/05/21 1505  BP: 122/81  Pulse: 100  Weight: 147 lb (66.7 kg)  Body mass index is 24.46 kg/m.           Physical Examination:   General appearance: alert, well appearing, and in no distress  Mental status:  alert, oriented to person, place, and time  Skin: warm & dry   Extremities: Edema: Trace    Cardiovascular: normal heart rate noted  Respiratory: normal respiratory effort, no distress  Abdomen: gravid, soft, non-tender  Pelvic: Cervical exam deferred         Fetal Status: Fetal Heart Rate (bpm): 145 Fundal Height: 33 cm Movement: Present    Fetal Surveillance Testing today: FHR 145   Chaperone: N/A    Results for orders placed or performed in visit on 09/05/21 (from the past 24 hour(s))  POC Urinalysis Dipstick OB   Collection Time: 09/05/21  3:12 PM  Result Value Ref Range   Color, UA     Clarity, UA     Glucose, UA Negative Negative   Bilirubin, UA     Ketones, UA small    Spec Grav, UA     Blood, UA neg    pH, UA     POC,PROTEIN,UA Trace Negative, Trace, Small (1+), Moderate (2+), Large (3+), 4+   Urobilinogen, UA     Nitrite, UA neg    Leukocytes, UA Trace (A) Negative   Appearance     Odor      Assessment & Plan:  High-risk pregnancy: G1P0000 at [redacted]w[redacted]d with an Estimated Date of Delivery: 10/19/21   1) GDM, stable,  CBG all good    Meds: No orders of the defined types were placed in this encounter.   Labs/procedures today: none  Treatment Plan:  sonogram for EFW next visit  Reviewed: Preterm labor symptoms and general obstetric precautions including but not limited to vaginal bleeding, contractions, leaking of fluid and fetal movement were reviewed in detail with the patient.  All questions were answered. Does have home bp cuff. Office bp cuff given: not applicable. Check bp weekly, let us know if consistently >140 and/or >90.  Follow-up: Return in about 2 weeks (around 09/19/2021) for OB sonogram for EFW, HROB.   No future appointments.  Orders Placed This Encounter  Procedures   US OB Follow Up   POC Urinalysis Dipstick OB   Lazaro Arms  09/05/2021 3:47 PM

## 2021-09-16 ENCOUNTER — Other Ambulatory Visit: Payer: Self-pay

## 2021-09-16 ENCOUNTER — Ambulatory Visit (INDEPENDENT_AMBULATORY_CARE_PROVIDER_SITE_OTHER): Payer: BC Managed Care – PPO

## 2021-09-16 ENCOUNTER — Ambulatory Visit (INDEPENDENT_AMBULATORY_CARE_PROVIDER_SITE_OTHER): Payer: BC Managed Care – PPO | Admitting: Obstetrics & Gynecology

## 2021-09-16 ENCOUNTER — Encounter: Payer: Self-pay | Admitting: Obstetrics & Gynecology

## 2021-09-16 VITALS — BP 118/85 | HR 111 | Wt 147.0 lb

## 2021-09-16 DIAGNOSIS — O0990 Supervision of high risk pregnancy, unspecified, unspecified trimester: Secondary | ICD-10-CM

## 2021-09-16 DIAGNOSIS — Z3A35 35 weeks gestation of pregnancy: Secondary | ICD-10-CM | POA: Diagnosis not present

## 2021-09-16 DIAGNOSIS — O24410 Gestational diabetes mellitus in pregnancy, diet controlled: Secondary | ICD-10-CM

## 2021-09-16 NOTE — Progress Notes (Signed)
HIGH-RISK PREGNANCY VISIT Patient name: Anna Guzman MRN [id number]  Date of birth: 23-Oct-1991 Chief Complaint:   Routine Prenatal Visit  History of Present Illness:   Anna Guzman is a 30 y.o. G8P0000 female at [redacted]w[redacted]d with an Estimated Date of Delivery: 10/19/21 being seen today for ongoing management of a high-risk pregnancy complicated by Class A1 DM.    Today she reports no complaints. Contractions: Not present. Vag. Bleeding: None.  Movement: Present. denies leaking of fluid.   Depression screen [facility] 2/9 08/01/2021 03/25/2021 10/06/2018 12/28/2017 10/01/2016  Decreased Interest 0 0 0 0 0  Down, Depressed, Hopeless 0 0 0 0 0  PHQ - 2 Score 0 0 0 0 0  Altered sleeping 0 0 0 - -  Tired, decreased energy 1 1 0 - -  Change in appetite 0 0 0 - -  Feeling bad or failure about yourself  0 0 0 - -  Trouble concentrating 0 0 0 - -  Moving slowly or fidgety/restless 0 0 0 - -  Suicidal thoughts 0 0 0 - -  PHQ-9 Score 1 1 0 - -     GAD 7 : Generalized Anxiety Score 08/01/2021 03/25/2021  Nervous, Anxious, on Edge 0 0  Control/stop worrying 0 0  Worry too much - different things 0 0  Trouble relaxing 0 0  Restless 0 0  Easily annoyed or irritable 0 0  Afraid - awful might happen 0 0  Total GAD 7 Score 0 0     Review of Systems:   Pertinent items are noted in HPI Denies abnormal vaginal discharge w/ itching/odor/irritation, headaches, visual changes, shortness of breath, chest pain, abdominal pain, severe nausea/vomiting, or problems with urination or bowel movements unless otherwise stated above. Pertinent History Reviewed:  Reviewed past medical,surgical, social, obstetrical and family history.  Reviewed problem list, medications and allergies. Physical Assessment:   Vitals:   09/16/21 1106  BP: 118/85  Pulse: (!) 111  Weight: 147 lb (66.7 kg)  Body mass index is 24.46 kg/m.           Physical Examination:   General appearance: alert, well appearing, and in no  distress  Mental status: alert, oriented to person, place, and time  Skin: warm & dry   Extremities: Edema: Trace    Cardiovascular: normal heart rate noted  Respiratory: normal respiratory effort, no distress  Abdomen: gravid, soft, non-tender  Pelvic: Cervical exam deferred         Fetal Status:     Movement: Present    Fetal Surveillance Testing today: sonogram EFW 86%   Chaperone: N/A    No results found for this or any previous visit (from the past 24 hour(s)).  Assessment & Plan:  High-risk pregnancy: G1P0000 at [redacted]w[redacted]d with an Estimated Date of Delivery: 10/19/21   1) Class A1DM, stable EFW 86%    Meds: No orders of the defined types were placed in this encounter.   Labs/procedures today: sonogram  Treatment Plan:  IOL 40 weeks or as indicated  Reviewed: Term labor symptoms and general obstetric precautions including but not limited to vaginal bleeding, contractions, leaking of fluid and fetal movement were reviewed in detail with the patient.  All questions were answered. Does have home bp cuff. Office bp cuff given: not applicable. Check bp weekly, let us know if consistently >140 and/or >90.  Follow-up: Return in about 10 days (around 09/26/2021) for HROB.   No future appointments.  No orders of the defined types  were placed in this encounter.  Amaryllis Dyke [place] Jeff  09/16/2021 12:02 PM

## 2021-09-16 NOTE — Progress Notes (Signed)
[place] 35+2 wks,cephalic,posterior placenta gr 3,FHR 164 bpm,EFW 3023 g 85%,AFI 10.8 cm

## 2021-09-23 ENCOUNTER — Other Ambulatory Visit: Payer: Self-pay

## 2021-09-23 ENCOUNTER — Encounter: Payer: Self-pay | Admitting: *Deleted

## 2021-09-23 ENCOUNTER — Telehealth: Payer: Self-pay | Admitting: Obstetrics & Gynecology

## 2021-09-23 ENCOUNTER — Telehealth: Payer: Self-pay | Admitting: Women's Health

## 2021-09-23 ENCOUNTER — Ambulatory Visit (INDEPENDENT_AMBULATORY_CARE_PROVIDER_SITE_OTHER): Payer: BC Managed Care – PPO | Admitting: *Deleted

## 2021-09-23 VITALS — BP 137/86 | HR 90 | Wt 148.2 lb

## 2021-09-23 DIAGNOSIS — Z3A36 36 weeks gestation of pregnancy: Secondary | ICD-10-CM

## 2021-09-23 DIAGNOSIS — O0990 Supervision of high risk pregnancy, unspecified, unspecified trimester: Secondary | ICD-10-CM

## 2021-09-23 LAB — POCT URINALYSIS DIPSTICK OB
Blood, UA: NEGATIVE
Glucose, UA: NEGATIVE
Ketones, UA: NEGATIVE
Leukocytes, UA: NEGATIVE
Nitrite, UA: NEGATIVE
POC,PROTEIN,UA: NEGATIVE

## 2021-09-23 NOTE — Telephone Encounter (Signed)
Patient was experiencing high blood pressure last night. Her first reading was 162/91 and the second reading was 150/90. She had swelling in the legs, face, hands, blurred vision and headache with her high blood pressure last night. This morning, her blood pressure was 140/85 for the first reading and 135/83 for the second reading. She's not experiencing any of the symptoms this morning other then a little swelling.

## 2021-09-23 NOTE — Progress Notes (Addendum)
   NURSE VISIT- BLOOD PRESSURE CHECK  SUBJECTIVE:  Anna Guzman is a 30 y.o. G71P0000 female here for BP check. She is [redacted]w[redacted]d pregnant    HYPERTENSION ROS:  Pregnant/postpartum:  Severe headaches that don't go away with tylenol/other medicines: No  Visual changes (seeing spots/double/blurred vision) No  Severe pain under right breast breast or in center of upper chest No  Severe nausea/vomiting No  Taking medicines as instructed not applicable    OBJECTIVE:  BP 137/86   Pulse 90   Wt 148 lb 3.2 oz (67.2 kg)   BMI 24.66 kg/m   Appearance alert, well appearing, and in no distress.  ASSESSMENT: Pregnancy [redacted]w[redacted]d  blood pressure check  PLAN: Discussed with Dr. Charlotta Newton   Recommendations: no changes needed   Follow-up: as scheduled   [name]  09/23/2021 11:33 AM   Chart reviewed for nurse visit. Agree with plan of care.  Myna Hidalgo, DO 09/23/2021 11:38 AM

## 2021-09-23 NOTE — Telephone Encounter (Signed)
Returned pt's call. Two identifiers used. Confirmed pt's symptoms and BP readings. Appt made for pt to come into the office for a BP check. Pt to bring cuff from home. Pt confirmed understanding.

## 2021-09-25 ENCOUNTER — Other Ambulatory Visit (HOSPITAL_COMMUNITY)
Admission: RE | Admit: 2021-09-25 | Discharge: 2021-09-25 | Disposition: A | Discharge status: Home / Self Care | Payer: BC Managed Care – PPO | Source: Ambulatory Visit | Attending: Obstetrics & Gynecology | Admitting: Obstetrics & Gynecology

## 2021-09-25 ENCOUNTER — Inpatient Hospital Stay (HOSPITAL_COMMUNITY): Admit: 2021-09-25 | Payer: BC Managed Care – PPO | Admitting: Obstetrics & Gynecology

## 2021-09-25 ENCOUNTER — Ambulatory Visit (INDEPENDENT_AMBULATORY_CARE_PROVIDER_SITE_OTHER): Payer: BC Managed Care – PPO | Admitting: Obstetrics & Gynecology

## 2021-09-25 ENCOUNTER — Ambulatory Visit: Payer: BC Managed Care – PPO | Admitting: Obstetrics & Gynecology

## 2021-09-25 ENCOUNTER — Other Ambulatory Visit: Payer: Self-pay

## 2021-09-25 ENCOUNTER — Encounter: Payer: Self-pay | Admitting: Obstetrics & Gynecology

## 2021-09-25 ENCOUNTER — Encounter (HOSPITAL_COMMUNITY): Payer: Self-pay | Admitting: Obstetrics and Gynecology

## 2021-09-25 ENCOUNTER — Inpatient Hospital Stay (HOSPITAL_COMMUNITY)
Admission: RE | Admit: 2021-09-25 | Discharge: 2021-09-29 | DRG: 807 | Disposition: A | Discharge status: Home / Self Care | Payer: BC Managed Care – PPO | Attending: Obstetrics and Gynecology | Admitting: Obstetrics and Gynecology

## 2021-09-25 ENCOUNTER — Telehealth: Payer: Self-pay | Admitting: *Deleted

## 2021-09-25 VITALS — BP 140/92 | HR 94 | Wt 148.0 lb

## 2021-09-25 DIAGNOSIS — O9952 Diseases of the respiratory system complicating childbirth: Secondary | ICD-10-CM | POA: Diagnosis present

## 2021-09-25 DIAGNOSIS — O24420 Gestational diabetes mellitus in childbirth, diet controlled: Secondary | ICD-10-CM | POA: Diagnosis present

## 2021-09-25 DIAGNOSIS — O24410 Gestational diabetes mellitus in pregnancy, diet controlled: Secondary | ICD-10-CM | POA: Diagnosis present

## 2021-09-25 DIAGNOSIS — R000 Tachycardia, unspecified: Secondary | ICD-10-CM | POA: Diagnosis not present

## 2021-09-25 DIAGNOSIS — J45909 Unspecified asthma, uncomplicated: Secondary | ICD-10-CM | POA: Diagnosis present

## 2021-09-25 DIAGNOSIS — Z20822 Contact with and (suspected) exposure to covid-19: Secondary | ICD-10-CM | POA: Diagnosis present

## 2021-09-25 DIAGNOSIS — O99892 Other specified diseases and conditions complicating childbirth: Secondary | ICD-10-CM | POA: Diagnosis not present

## 2021-09-25 DIAGNOSIS — O1414 Severe pre-eclampsia complicating childbirth: Principal | ICD-10-CM | POA: Diagnosis present

## 2021-09-25 DIAGNOSIS — Z3A36 36 weeks gestation of pregnancy: Secondary | ICD-10-CM

## 2021-09-25 DIAGNOSIS — O134 Gestational [pregnancy-induced] hypertension without significant proteinuria, complicating childbirth: Secondary | ICD-10-CM | POA: Diagnosis present

## 2021-09-25 DIAGNOSIS — O0993 Supervision of high risk pregnancy, unspecified, third trimester: Secondary | ICD-10-CM

## 2021-09-25 DIAGNOSIS — E039 Hypothyroidism, unspecified: Secondary | ICD-10-CM | POA: Diagnosis present

## 2021-09-25 DIAGNOSIS — Z880 Allergy status to penicillin: Secondary | ICD-10-CM | POA: Diagnosis not present

## 2021-09-25 DIAGNOSIS — R0789 Other chest pain: Secondary | ICD-10-CM | POA: Diagnosis not present

## 2021-09-25 DIAGNOSIS — O99284 Endocrine, nutritional and metabolic diseases complicating childbirth: Secondary | ICD-10-CM | POA: Diagnosis present

## 2021-09-25 DIAGNOSIS — O1410 Severe pre-eclampsia, unspecified trimester: Secondary | ICD-10-CM | POA: Diagnosis present

## 2021-09-25 DIAGNOSIS — O1413 Severe pre-eclampsia, third trimester: Secondary | ICD-10-CM

## 2021-09-25 LAB — CBC
HCT: 37.1 % (ref 36.0–46.0)
Hemoglobin: 12.3 g/dL (ref 12.0–15.0)
MCH: 28.9 pg (ref 26.0–34.0)
MCHC: 33.2 g/dL (ref 30.0–36.0)
MCV: 87.3 fL (ref 80.0–100.0)
Platelets: 300 10*3/uL (ref 150–400)
RBC: 4.25 MIL/uL (ref 3.87–5.11)
RDW: 13.3 % (ref 11.5–15.5)
WBC: 8.3 10*3/uL (ref 4.0–10.5)
nRBC: 0 % (ref 0.0–0.2)

## 2021-09-25 LAB — COMPREHENSIVE METABOLIC PANEL
ALT: 15 U/L (ref 0–44)
AST: 51 U/L — ABNORMAL HIGH (ref 15–41)
Albumin: 3.1 g/dL — ABNORMAL LOW (ref 3.5–5.0)
Alkaline Phosphatase: 123 U/L (ref 38–126)
Anion gap: 10 (ref 5–15)
BUN: 6 mg/dL (ref 6–20)
CO2: 20 mmol/L — ABNORMAL LOW (ref 22–32)
Calcium: 9.3 mg/dL (ref 8.9–10.3)
Chloride: 104 mmol/L (ref 98–111)
Creatinine, Ser: 0.54 mg/dL (ref 0.44–1.00)
GFR, Estimated: >60 mL/min (ref >60)
Glucose, Bld: 85 mg/dL (ref 70–99)
Potassium: 3.7 mmol/L (ref 3.5–5.1)
Sodium: 134 mmol/L — ABNORMAL LOW (ref 135–145)
Total Bilirubin: 0.6 mg/dL (ref 0.3–1.2)
Total Protein: 6.6 g/dL (ref 6.5–8.1)

## 2021-09-25 LAB — PROTEIN / CREATININE RATIO, URINE
Creatinine, Urine: 16.11 mg/dL
Total Protein, Urine: <6 mg/dL

## 2021-09-25 LAB — RESP PANEL BY RT-PCR (FLU A&B, COVID) ARPGX2
Influenza A by PCR: NEGATIVE
Influenza B by PCR: NEGATIVE
SARS Coronavirus 2 by RT PCR: NEGATIVE

## 2021-09-25 LAB — TYPE AND SCREEN
ABO/RH(D): A POS
Antibody Screen: NEGATIVE

## 2021-09-25 LAB — GLUCOSE, CAPILLARY: Glucose-Capillary: 115 mg/dL — ABNORMAL HIGH (ref 70–99)

## 2021-09-25 MED ORDER — PENICILLIN G POT IN DEXTROSE 60000 UNIT/ML IV SOLN
3.0000 10*6.[IU] | INTRAVENOUS | Status: DC
Start: 2021-09-25 — End: 2021-09-25

## 2021-09-25 MED ORDER — LACTATED RINGERS IV SOLN: INTRAVENOUS | Status: DC

## 2021-09-25 MED ORDER — METOCLOPRAMIDE HCL 5 MG/ML IJ SOLN
10.0000 mg | Freq: Once | INTRAMUSCULAR | Status: AC
  Administered 2021-09-25: 10 mg via INTRAVENOUS
  Filled 2021-09-25: qty 2

## 2021-09-25 MED ORDER — CEFAZOLIN SODIUM-DEXTROSE 2-4 GM/100ML-% IV SOLN
2.0000 g | Freq: Once | INTRAVENOUS | Status: AC
  Administered 2021-09-25: 2 g via INTRAVENOUS

## 2021-09-25 MED ORDER — ACETAMINOPHEN 500 MG PO TABS
1000.0000 mg | ORAL_TABLET | Freq: Once | ORAL | Status: AC
  Administered 2021-09-25: 1000 mg via ORAL
  Filled 2021-09-25: qty 2

## 2021-09-25 MED ORDER — LABETALOL HCL 5 MG/ML IV SOLN: 40.0000 mg | INTRAVENOUS | Status: DC | PRN

## 2021-09-25 MED ORDER — SODIUM CHLORIDE 0.9 % IV SOLN: 5.0000 10*6.[IU] | Freq: Once | INTRAVENOUS | Status: DC

## 2021-09-25 MED ORDER — ACETAMINOPHEN 325 MG PO TABS
650.0000 mg | ORAL_TABLET | ORAL | Status: DC | PRN
  Administered 2021-09-26: 650 mg via ORAL
  Filled 2021-09-25: qty 2

## 2021-09-25 MED ORDER — CEFAZOLIN SODIUM-DEXTROSE 2-4 GM/100ML-% IV SOLN
INTRAVENOUS | Status: AC
  Filled 2021-09-25: qty 100

## 2021-09-25 MED ORDER — CEFAZOLIN SODIUM-DEXTROSE 1-4 GM/50ML-% IV SOLN
1.0000 g | Freq: Three times a day (TID) | INTRAVENOUS | Status: DC
  Administered 2021-09-26: 1 g via INTRAVENOUS
  Filled 2021-09-25 (×4): qty 50

## 2021-09-25 MED ORDER — LABETALOL HCL 5 MG/ML IV SOLN: 20.0000 mg | INTRAVENOUS | Status: DC | PRN

## 2021-09-25 MED ORDER — MAGNESIUM SULFATE 40 GM/1000ML IV SOLN: 2.0000 g/h | INTRAVENOUS | Status: DC

## 2021-09-25 MED ORDER — OXYTOCIN BOLUS FROM INFUSION: 333.0000 mL | Freq: Once | INTRAVENOUS | Status: DC

## 2021-09-25 MED ORDER — OXYCODONE-ACETAMINOPHEN 5-325 MG PO TABS: 1.0000 | ORAL_TABLET | ORAL | Status: DC | PRN

## 2021-09-25 MED ORDER — HYDRALAZINE HCL 20 MG/ML IJ SOLN: 10.0000 mg | INTRAMUSCULAR | Status: DC | PRN

## 2021-09-25 MED ORDER — LABETALOL HCL 5 MG/ML IV SOLN: 80.0000 mg | INTRAVENOUS | Status: DC | PRN

## 2021-09-25 MED ORDER — OXYCODONE-ACETAMINOPHEN 5-325 MG PO TABS: 2.0000 | ORAL_TABLET | ORAL | Status: DC | PRN

## 2021-09-25 MED ORDER — MISOPROSTOL 50MCG HALF TABLET
50.0000 ug | ORAL_TABLET | ORAL | Status: DC
  Administered 2021-09-25 – 2021-09-26 (×2): 50 ug via BUCCAL
  Filled 2021-09-25 (×2): qty 1

## 2021-09-25 MED ORDER — ONDANSETRON HCL 4 MG/2ML IJ SOLN
4.0000 mg | Freq: Four times a day (QID) | INTRAMUSCULAR | Status: DC | PRN
  Administered 2021-09-26: 4 mg via INTRAVENOUS
  Filled 2021-09-25: qty 2

## 2021-09-25 MED ORDER — OXYTOCIN-SODIUM CHLORIDE 30-0.9 UT/500ML-% IV SOLN
2.5000 [IU]/h | INTRAVENOUS | Status: DC
  Filled 2021-09-25: qty 500

## 2021-09-25 MED ORDER — SOD CITRATE-CITRIC ACID 500-334 MG/5ML PO SOLN: 30.0000 mL | ORAL | Status: DC | PRN

## 2021-09-25 MED ORDER — LIDOCAINE HCL (PF) 1 % IJ SOLN: 30.0000 mL | INTRAMUSCULAR | Status: DC | PRN

## 2021-09-25 MED ORDER — LACTATED RINGERS IV SOLN: 500.0000 mL | INTRAVENOUS | Status: DC | PRN

## 2021-09-25 MED ORDER — FENTANYL CITRATE (PF) 100 MCG/2ML IJ SOLN
50.0000 ug | INTRAMUSCULAR | Status: DC | PRN
  Administered 2021-09-26: 50 ug via INTRAVENOUS
  Administered 2021-09-26 (×2): 100 ug via INTRAVENOUS
  Filled 2021-09-25 (×3): qty 2

## 2021-09-25 MED ORDER — MAGNESIUM SULFATE BOLUS VIA INFUSION: 4.0000 g | Freq: Once | INTRAVENOUS | Status: DC

## 2021-09-25 NOTE — Progress Notes (Signed)
HIGH-RISK PREGNANCY VISIT Patient name: Anna Guzman MRN [id number]  Date of birth: 13-Jan-1991 Chief Complaint:   Blood Pressure Check (Dizzy, headaches, blurred vision) and Routine Prenatal Visit  History of Present Illness:   Anna Guzman is a 30 y.o. G10P0000 female at 65w4dwith an Estimated Date of Delivery: 10/19/21 being seen today for ongoing management of a high-risk pregnancy complicated by:  GDMA1- well controlled  Today she reports  blurry vision, dizziness and headache . Pt took BP at work 160/90-100s.  Pt also had BP read at home 160/100 yesterday  Contractions: Irregular. Vag. Bleeding: None.  Movement: Present. denies leaking of fluid.   [facility] 08/01/2021 03/25/2021 10/06/2018 12/28/2017 10/01/2016  Decreased Interest 0 0 0 0 0  Down, Depressed, Hopeless 0 0 0 0 0  PHQ - 2 Score 0 0 0 0 0  Altered sleeping 0 0 0 - -  Tired, decreased energy 1 1 0 - -  Change in appetite 0 0 0 - -  Feeling bad or failure about yourself  0 0 0 - -  Trouble concentrating 0 0 0 - -  Moving slowly or fidgety/restless 0 0 0 - -  Suicidal thoughts 0 0 0 - -  PHQ-9 Score 1 1 0 - -     Current Outpatient Medications  Medication Instructions   aspirin 81 mg, Oral, Daily, Swallow whole.   Blood Glucose Monitoring Suppl (ACCU-CHEK GUIDE ME) w/Device KIT 1 each, Does not apply, 4 times daily   Doxylamine-Pyridoxine 10-10 MG TBEC 1 tablet, Oral, Daily at bedtime   glucose blood test strip Use as instructed to check blood sugar 4 times daily   Lancets (ONETOUCH ULTRASOFT) lancets Use as directed to check blood sugar 4 times daily   levothyroxine (SYNTHROID) 50 MCG tablet TAKE 1 TABLET BY MOUTH EVERY DAY BEFORE BREAKFAST   Prenatal Vit-Fe Fumarate-FA (TRINATE) TABS Take 1 daily   PULMICORT FLEXHALER 180 MCG/ACT inhaler 2 puffs, Inhalation, 2 times daily     Review of Systems:   Pertinent items are noted in HPI Denies abnormal vaginal discharge w/ itching/odor/irritation,  shortness of breath, chest pain, abdominal pain, severe nausea/vomiting, or problems with urination or bowel movements unless otherwise stated above. Pertinent History Reviewed:  Reviewed past medical,surgical, social, obstetrical and family history.  Reviewed problem list, medications and allergies. Physical Assessment:   Vitals:   09/25/21 1414 09/25/21 1415  BP: (!) 143/99 (!) 140/92  Pulse: 94   Weight: 148 lb (67.1 kg)   Body mass index is 24.63 kg/m.           Physical Examination:   General appearance: alert, well appearing, and in no distress  Mental status: alert, oriented to person, place, and time  Skin: warm & dry   Extremities: Edema: Mild pitting, slight indentation    Cardiovascular: normal heart rate noted  Respiratory: normal respiratory effort, no distress  Abdomen: gravid, soft, non-tender  Pelvic: Cervical exam performed  Dilation: 1.5 Effacement (%): 30 Station: -3  Fetal Status: Fetal Heart Rate (bpm): 140 Fundal Height: 36 cm Movement: Present Presentation: Vertex SVE: 1.5/30/-3, vertex  Fetal Surveillance Testing today: doppler   Chaperone:  declined     Assessment & Plan:  High-risk pregnancy: G1P0000 at 373w4dith an Estimated Date of Delivery: 10/19/21   1) Preeclampsia with severe features -Pt meets criteria for severe based on BP and symptoms -Advised induction, L&D and NICU notified -pt aware of possible transfer should NICU admission be indicated  2) GDMA1 Well controleed  Meds: No orders of the defined types were placed in this encounter.   Labs/procedures today: GBS, GC/C collected  Treatment Plan:  pt to go to [facility] for [facility]OL   Follow-up: Return for sent to L&D, postpartum visit x 1wk.   No future appointments.  Orders Placed This Encounter  Procedures   Culture, beta strep (group b only)    Janyth Pupa, DO Attending [place], [place] for [employer], [place]

## 2021-09-25 NOTE — H&P (Signed)
OBSTETRIC ADMISSION HISTORY AND PHYSICAL  Anna Guzman is a 30 y.o. female G1P0000 with IUP at 73w4dby LMP c/s 7 week [place] for I[place] due to pre-eclampsia with severe features. [facility] elevated blood pressures, blurred vision, headache, and feeling dizzy. She had a 160/100 BP yesterday with her home cuff and then BP >160 while at work today. Did endorse that her BP was still >160 while at work after waiting 30 minutes. On arrival to her office appt, her BP was 140/90's.   Upon arrival here, reports her blurred vision and headache have resolved after getting a quick nap. BP is normal. She reports +FMs, No LOF, no VB, peripheral edema, or RUQ pain.  She plans on breast feeding. She declines birth control.  [facility]  Dating: By embryo transfer, c/s 7 week [place] --->  Estimated Date of Delivery: 10/19/21  Sono:    @[redacted]w[redacted]d , CWD, normal anatomy, cephalic presentation, [id number] 85% EFW   Prenatal History/Complications:  --IUI pregnancy  --A1GDM  --Asthma  --Hypothyroidism on synthroid    Past Medical History: Past Medical History:  Diagnosis Date   Asthma    MTHFR gene mutation    Restless leg syndrome     Past Surgical History: Past Surgical History:  Procedure Laterality Date   APPENDECTOMY     WISDOM TOOTH EXTRACTION      Obstetrical History: OB History     Gravida  1   Para  0   Term  0   Preterm  0   AB  0   Living  0      SAB  0   IAB  0   Ectopic  0   Multiple  0   Live Births  0           Social History Social History   Socioeconomic History   Marital status: Married    Spouse name: Not on file   Number of children: Not on file   Years of education: Not on file   Highest education level: Not on file  Occupational History   Not on file  Tobacco Use   Smoking status: Never   Smokeless tobacco: Never  Vaping Use   Vaping Use: Never used  Substance and Sexual  Activity   Alcohol use: Not Currently    Comment: very rarely   Drug use: No   Sexual activity: Yes    Birth control/protection: None  Other Topics Concern   Not on file  Social History Narrative   Not on file   Social Determinants of Health   Financial Resource Strain: Low Risk    Difficulty of Paying Living Expenses: Not hard at all  Food Insecurity: No Food Insecurity   Worried About RCharity fundraiserin the Last Year: Never true   Ran Out of Food in the Last Year: Never true  Transportation Needs: No Transportation Needs   Lack of Transportation (Medical): No   Lack of Transportation (Non-Medical): No  Physical Activity: Inactive   Days of Exercise per Week: 0 days   Minutes of Exercise per Session: 0 min  Stress: No Stress Concern Present   Feeling of Stress : Not at all  Social Connections: Socially Integrated   Frequency of Communication with Friends and Family: Twice a week   Frequency of Social Gatherings with Friends and Family: Twice a week   Attends Religious Services: More  than 4 times per year   Active Member of Clubs or Organizations: Yes   Attends Music therapist: More than 4 times per year   Marital Status: Married    Family History: Family History  Problem Relation Age of Onset   Osteoporosis Mother    Hyperlipidemia Father    Other Brother        has hearing aids   Alzheimer's disease Paternal Grandfather     Allergies: Allergies  Allergen Reactions   Sulfa Antibiotics     Medications Prior to Admission  Medication Sig Dispense Refill Last Dose   aspirin 81 MG EC tablet Take 1 tablet (81 mg total) by mouth daily. Swallow whole. 90 tablet 3 09/25/2021   levothyroxine (SYNTHROID) 50 MCG tablet TAKE 1 TABLET BY MOUTH EVERY DAY BEFORE BREAKFAST (Patient taking differently: Sunday, Monday, Wed,nesday, Thurs,day Friday 26mg Tuesday, Saturday 1037m) 90 tablet 2 09/25/2021   Prenatal Vit-Fe Fumarate-FA (TRINATE) TABS Take 1 daily 30  tablet 12 09/24/2021   PULMICORT FLEXHALER 180 MCG/ACT inhaler Inhale 2 puffs into the lungs 2 (two) times daily.   09/25/2021   Blood Glucose Monitoring Suppl (ACCU-CHEK GUIDE ME) w/Device KIT 1 each by Does not apply route 4 (four) times daily. 1 kit 0    Doxylamine-Pyridoxine 10-10 MG TBEC Take 1 tablet by mouth at bedtime.      glucose blood test strip Use as instructed to check blood sugar 4 times daily 100 each 12    Lancets (ONETOUCH ULTRASOFT) lancets Use as directed to check blood sugar 4 times daily 100 each PRN      Review of Systems   All systems reviewed and negative except as stated in HPI  Blood pressure 130/84, pulse 89, temperature 98.3 F (36.8 C), temperature source Oral, resp. rate 16, height 5' 5"  (1.651 m), weight 68.1 kg. General appearance: alert, cooperative, and no distress Lungs: Normal WOB  Heart: regular rate and rhythm Abdomen: soft, non-tender; bowel sounds normal Extremities: Homans sign is negative, no sign of DVT Fetal monitoringBaseline: 145 bpm, Variability: Good {> 6 bpm), Accelerations: Reactive, and Decelerations: Absent Uterine activity uterine irritability    Prenatal labs: ABO, Rh: --/--/A POS (10/19 1548) Antibody: NEG (10/19 1548) Rubella: 6.20 (04/18 1636) RPR: Non Reactive (08/25 0838)  HBsAg: Negative (04/18 1636)  HIV: Non Reactive (08/25 086578 GBS:   Unknown, culture collected at office appt.  Genetic screening  declined Anatomy [place] female   Prenatal Transfer Tool  Maternal Diabetes: Yes:  Diabetes Type:  Diet controlled Genetic Screening: Normal Maternal Ultrasounds/Referrals: Normal Fetal Ultrasounds or other Referrals:  None Maternal Substance Abuse:  No Significant Maternal Medications:  None Significant Maternal Lab Results: None   Patient Active Problem List   Diagnosis Date Noted   Hypertension in pregnancy, preeclampsia, severe 09/25/2021   Gestational diabetes mellitus, class A1 08/05/2021   Hypothyroidism  03/26/2021   Infertility, female 03/26/2021   Supervision of high risk pregnancy, antepartum 03/25/2021   Family history of MTHFR deficiency 01/20/2019   Amenorrhea, secondary 10/06/2018    Assessment/Plan:  Anna Guzman a [age].o. G1P0000 at 3663w4dre for evaluation of gestational hypertension with concern for preeclampsia with severe features.   #Gestational hypertension, c/f Pre-e with severe features: BP WNL and asymptomatic upon arrival, HA + blurred vision earlier today that resolved spontaneously. Will monitor her blood pressures and obtain baseline pre-e labs. IOL pending the above. Discussed with Dr. WouSi Raiderd ArnRoselie Awkward #FWB: Cat 1  #  ID: GBS Unknown, culture pending. She reports a history of hives only recently with PCN, will do Ancef.  #MOF: Breastfeeding  #MOC: Declines, aware of options  #Circ: Yes  #A1GDM: Well controlled. EFW 85%, 3023g at [redacted]w[redacted]d Monitor CBGs.   #Hypothyroidism: Synthroid 564m daily, but 10032mTuesday/Sat. 35m27maily prior to pregnancy.   SamaPatriciaann Clan  09/25/2021

## 2021-09-25 NOTE — Telephone Encounter (Signed)
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  BP is >160/110.  Requested patient come to our office for a BP check with our cuff as this is not the one she normally uses.  Pt agreeable to plan and will come at 2pm today.

## 2021-09-25 NOTE — Progress Notes (Signed)
Anna Guzman is a 30 y.o. G1P0000 at [redacted]w[redacted]d  admitted for induction of labor due to preEclampsia with SF (symptoms of headaches and blurry vision).  Subjective: Feels headache is coming back two hours after Tylenol. Denies blurry vision at  this time.  Discussed history further with patient and states she had blurry vision three days ago on Sunday night and had a severe range BP (SBP of 160s) at that time. She then continued to have BP in 140s every day until today when she had headaches and blurry vision again and had a severe range BP in 160s at work. Currently she has a headache again but no blurry vision.  Objective: BP (!) 138/99   Pulse (!) 102   Temp 98 F (36.7 C) (Oral)   Resp 18   Ht 5\' 5"  (1.651 m)   Wt 68.1 kg   BMI 24.98 kg/m  No intake/output data recorded. No intake/output data recorded.  FHT:  FHR: 150 bpm, variability: moderate,  accelerations:  Present,  decelerations:  Absent UC:   irregular SVE:   Dilation: 1 Effacement (%): 40 Station: -3 Exam by:: Dr [email or web address]  Labs: Lab Results  Component Value Date   WBC 8.3 09/25/2021   HGB 12.3 09/25/2021   HCT 37.1 09/25/2021   MCV 87.3 09/25/2021   PLT 300 09/25/2021    Assessment / Plan: Induction of labor due to preE with SF (intermittent headaches and blurry vision for 3 days) starting IOL now. Given multiple episodes of headaches with blurry vision will treat as preE with SF and begin IOL. Currently she has a mild headache that is better than previous HA and no blurry vision and her blood pressures are in the mild range and therefore we will hold off on starting Mg.   Labor:  Cytotec#1 given and FB placed. At initial placement of FB two quarter sized clots came out after placement. FB removed, bleeding assessed. No active bleeding. FB manually replaced on oppsite side of cervix and no bleeding appreciated after. Will continue to monitor Preeclampsia:  no signs or symptoms of toxicity and labs stable. P:C below  detectable however given above symptoms will induce. Fetal Wellbeing:  Category I Pain Control:  Plans Epidural I/D:   GBS unknown, culture sent in office. Patient with PCN allergy, Ancef given Anticipated MOD:  NSVD  #Elevated AST Patient's AST is mildly elevated at  51. ALT normal. Last AST check was 9 years ago so unclear if recent change. Will continue to monitor given preE.   Discussed plan in depth with Dr. 09/27/2021 who is in agreement.   Debroah Loop 09/25/2021, 9:33 PM

## 2021-09-26 ENCOUNTER — Inpatient Hospital Stay (HOSPITAL_COMMUNITY): Payer: BC Managed Care – PPO | Admitting: Anesthesiology

## 2021-09-26 ENCOUNTER — Encounter (HOSPITAL_COMMUNITY): Payer: Self-pay | Admitting: Obstetrics and Gynecology

## 2021-09-26 ENCOUNTER — Encounter: Payer: BC Managed Care – PPO | Admitting: Obstetrics & Gynecology

## 2021-09-26 DIAGNOSIS — O1410 Severe pre-eclampsia, unspecified trimester: Secondary | ICD-10-CM

## 2021-09-26 DIAGNOSIS — J45909 Unspecified asthma, uncomplicated: Secondary | ICD-10-CM

## 2021-09-26 DIAGNOSIS — O1414 Severe pre-eclampsia complicating childbirth: Secondary | ICD-10-CM

## 2021-09-26 DIAGNOSIS — O24419 Gestational diabetes mellitus in pregnancy, unspecified control: Secondary | ICD-10-CM

## 2021-09-26 DIAGNOSIS — Z3A36 36 weeks gestation of pregnancy: Secondary | ICD-10-CM

## 2021-09-26 DIAGNOSIS — O24420 Gestational diabetes mellitus in childbirth, diet controlled: Secondary | ICD-10-CM

## 2021-09-26 DIAGNOSIS — O99284 Endocrine, nutritional and metabolic diseases complicating childbirth: Secondary | ICD-10-CM

## 2021-09-26 DIAGNOSIS — O134 Gestational [pregnancy-induced] hypertension without significant proteinuria, complicating childbirth: Secondary | ICD-10-CM

## 2021-09-26 LAB — CBC
HCT: 35.1 % — ABNORMAL LOW (ref 36.0–46.0)
Hemoglobin: 12 g/dL (ref 12.0–15.0)
MCH: 29.6 pg (ref 26.0–34.0)
MCHC: 34.2 g/dL (ref 30.0–36.0)
MCV: 86.7 fL (ref 80.0–100.0)
Platelets: 274 10*3/uL (ref 150–400)
RBC: 4.05 MIL/uL (ref 3.87–5.11)
RDW: 13.3 % (ref 11.5–15.5)
WBC: 15.2 10*3/uL — ABNORMAL HIGH (ref 4.0–10.5)
nRBC: 0 % (ref 0.0–0.2)

## 2021-09-26 LAB — GLUCOSE, CAPILLARY
Glucose-Capillary: 93 mg/dL (ref 70–99)
Glucose-Capillary: 105 mg/dL — ABNORMAL HIGH (ref 70–99)
Glucose-Capillary: 94 mg/dL (ref 70–99)

## 2021-09-26 LAB — RPR: RPR Ser Ql: NONREACTIVE

## 2021-09-26 MED ORDER — ONDANSETRON HCL 4 MG/2ML IJ SOLN: 4.0000 mg | INTRAMUSCULAR | Status: DC | PRN

## 2021-09-26 MED ORDER — TERBUTALINE SULFATE 1 MG/ML IJ SOLN
0.2500 mg | Freq: Once | INTRAMUSCULAR | Status: AC | PRN
  Administered 2021-09-26 (×2): 0.25 mg via SUBCUTANEOUS
  Filled 2021-09-26: qty 1

## 2021-09-26 MED ORDER — OXYTOCIN-SODIUM CHLORIDE 30-0.9 UT/500ML-% IV SOLN
1.0000 m[IU]/min | INTRAVENOUS | Status: DC
  Administered 2021-09-26: 2 m[IU]/min via INTRAVENOUS

## 2021-09-26 MED ORDER — SOD CITRATE-CITRIC ACID 500-334 MG/5ML PO SOLN
ORAL | Status: AC
  Filled 2021-09-26: qty 30

## 2021-09-26 MED ORDER — DIPHENHYDRAMINE HCL 50 MG/ML IJ SOLN
12.5000 mg | INTRAMUSCULAR | Status: DC | PRN
Start: 2021-09-26 — End: 2021-09-26

## 2021-09-26 MED ORDER — LACTATED RINGERS IV SOLN: 500.0000 mL | Freq: Once | INTRAVENOUS | Status: DC

## 2021-09-26 MED ORDER — SENNOSIDES-DOCUSATE SODIUM 8.6-50 MG PO TABS
2.0000 | ORAL_TABLET | ORAL | Status: DC
  Administered 2021-09-26: 2 via ORAL
  Filled 2021-09-26: qty 2

## 2021-09-26 MED ORDER — EPHEDRINE 5 MG/ML INJ: 10.0000 mg | INTRAVENOUS | Status: DC | PRN

## 2021-09-26 MED ORDER — LIDOCAINE HCL (PF) 1 % IJ SOLN
INTRAMUSCULAR | Status: DC | PRN
  Administered 2021-09-26: 2 mL via EPIDURAL
  Administered 2021-09-26: 5 mL via EPIDURAL
  Administered 2021-09-26: 3 mL via EPIDURAL

## 2021-09-26 MED ORDER — PHENYLEPHRINE 40 MCG/ML (10ML) SYRINGE FOR IV PUSH (FOR BLOOD PRESSURE SUPPORT)
80.0000 ug | PREFILLED_SYRINGE | INTRAVENOUS | Status: DC | PRN
  Filled 2021-09-26: qty 10

## 2021-09-26 MED ORDER — ACETAMINOPHEN 325 MG PO TABS: 650.0000 mg | ORAL_TABLET | ORAL | Status: DC | PRN

## 2021-09-26 MED ORDER — SODIUM CHLORIDE 0.9% FLUSH: 3.0000 mL | INTRAVENOUS | Status: DC | PRN

## 2021-09-26 MED ORDER — COCONUT OIL OIL: 1.0000 "application " | TOPICAL_OIL | Status: DC | PRN

## 2021-09-26 MED ORDER — WITCH HAZEL-GLYCERIN EX PADS: 1.0000 "application " | MEDICATED_PAD | CUTANEOUS | Status: DC | PRN

## 2021-09-26 MED ORDER — ZOLPIDEM TARTRATE 5 MG PO TABS: 5.0000 mg | ORAL_TABLET | Freq: Every evening | ORAL | Status: DC | PRN

## 2021-09-26 MED ORDER — FENTANYL-BUPIVACAINE-NACL 0.5-0.125-0.9 MG/250ML-% EP SOLN
12.0000 mL/h | EPIDURAL | Status: DC | PRN
  Administered 2021-09-26: 12 mL/h via EPIDURAL
  Filled 2021-09-26: qty 250

## 2021-09-26 MED ORDER — PRENATAL MULTIVITAMIN CH
1.0000 | ORAL_TABLET | Freq: Every day | ORAL | Status: DC
  Administered 2021-09-27 – 2021-09-29 (×3): 1 via ORAL
  Filled 2021-09-26 (×3): qty 1

## 2021-09-26 MED ORDER — SIMETHICONE 80 MG PO CHEW: 80.0000 mg | CHEWABLE_TABLET | ORAL | Status: DC | PRN

## 2021-09-26 MED ORDER — BUDESONIDE 0.25 MG/2ML IN SUSP
0.2500 mg | Freq: Two times a day (BID) | RESPIRATORY_TRACT | Status: DC
  Filled 2021-09-26 (×3): qty 2

## 2021-09-26 MED ORDER — TETANUS-DIPHTH-ACELL PERTUSSIS 5-2.5-18.5 LF-MCG/0.5 IM SUSY: 0.5000 mL | PREFILLED_SYRINGE | Freq: Once | INTRAMUSCULAR | Status: DC

## 2021-09-26 MED ORDER — DIPHENHYDRAMINE HCL 25 MG PO CAPS: 25.0000 mg | ORAL_CAPSULE | Freq: Four times a day (QID) | ORAL | Status: DC | PRN

## 2021-09-26 MED ORDER — LEVOTHYROXINE SODIUM 50 MCG PO TABS
50.0000 ug | ORAL_TABLET | Freq: Every day | ORAL | Status: DC
  Administered 2021-09-27 – 2021-09-29 (×3): 50 ug via ORAL
  Filled 2021-09-26 (×3): qty 1

## 2021-09-26 MED ORDER — DIBUCAINE (PERIANAL) 1 % EX OINT: 1.0000 "application " | TOPICAL_OINTMENT | CUTANEOUS | Status: DC | PRN

## 2021-09-26 MED ORDER — IBUPROFEN 600 MG PO TABS
600.0000 mg | ORAL_TABLET | Freq: Four times a day (QID) | ORAL | Status: DC
  Administered 2021-09-26 – 2021-09-29 (×11): 600 mg via ORAL
  Filled 2021-09-26 (×12): qty 1

## 2021-09-26 MED ORDER — BENZOCAINE-MENTHOL 20-0.5 % EX AERO
1.0000 "application " | INHALATION_SPRAY | CUTANEOUS | Status: DC | PRN
  Administered 2021-09-26: 1 via TOPICAL
  Filled 2021-09-26: qty 56

## 2021-09-26 MED ORDER — ONDANSETRON HCL 4 MG PO TABS: 4.0000 mg | ORAL_TABLET | ORAL | Status: DC | PRN

## 2021-09-26 MED ORDER — MEASLES, MUMPS & RUBELLA VAC IJ SOLR: 0.5000 mL | Freq: Once | INTRAMUSCULAR | Status: DC

## 2021-09-26 MED ORDER — SODIUM CHLORIDE 0.9 % IV SOLN: 250.0000 mL | INTRAVENOUS | Status: DC | PRN

## 2021-09-26 MED ORDER — SODIUM CHLORIDE 0.9% FLUSH: 3.0000 mL | Freq: Two times a day (BID) | INTRAVENOUS | Status: DC

## 2021-09-26 MED ORDER — PHENYLEPHRINE 40 MCG/ML (10ML) SYRINGE FOR IV PUSH (FOR BLOOD PRESSURE SUPPORT): 80.0000 ug | PREFILLED_SYRINGE | INTRAVENOUS | Status: DC | PRN

## 2021-09-26 NOTE — Progress Notes (Signed)
Labor Progress Note  Recurrent late decelerations noted on FHT. Uterine tachysystole with contractions every 1 minute. Tracing not improving with position changes, bolus, or discontinuation of Pit (was at 2 only). BP normal, had epidural placed around 7am. Terb IM given with slowing of contractions and resolution of decelerations.   Will monitor and assess for AROM on next check. Continuing holding pit for now.   Allayne Stack, DO

## 2021-09-26 NOTE — Lactation Note (Signed)
This note was copied from a baby's chart. Lactation Consultation Note  Patient Name: Anna Guzman SFKCL'E Date: 09/26/2021 Reason for consult: L&D Initial assessment;Late-preterm 34-36.6wks;1st time breastfeeding;Primapara;Breastfeeding assistance;Maternal endocrine disorder Age:30 mins  Baby STS on moms chest / calm with eyes open. LC offered to move baby towards the nipple and baby latched with depth / swallows and still feeding at 24 mins. ( LD aware of start time and to document total time of feeding. Mom was comfortable with entire feeding. Mom aware she will be seen again today.   Maternal Data Has patient been taught Hand Expression?: Yes (several drops noted)  Feeding Mother's Current Feeding Choice: Breast Milk  LATCH Score Latch: Grasps breast easily, tongue down, lips flanged, rhythmical sucking.  Audible Swallowing: Spontaneous and intermittent  Type of Nipple: Everted at rest and after stimulation  Comfort (Breast/Nipple): Soft / non-tender  Hold (Positioning): Assistance needed to correctly position infant at breast and maintain latch.  LATCH Score: 9   Lactation Tools Discussed/Used    Interventions Interventions: Breast feeding basics reviewed;Assisted with latch;Skin to skin;Breast massage;Hand express;Breast compression;Adjust position;Support pillows;Education  Discharge    Consult Status Consult Status: Follow-up from L&D Date: 09/26/21 Follow-up type: In-patient    Anna Guzman 09/26/2021, 3:47 PM

## 2021-09-26 NOTE — Discharge Summary (Addendum)
Postpartum Discharge Summary  Date of Service updated 09/28/2021     Patient Name: Anna Guzman DOB: [birth year] MRN: [id number]  Date of admission: 09/25/2021 Delivery date:09/26/2021  Delivering provider: Patriciaann Clan  Date of discharge: 09/28/2021  Admitting diagnosis: Hypertension in pregnancy, preeclampsia, severe [O14.10] Intrauterine pregnancy: [redacted]w[redacted]d    Secondary diagnosis:  Active Problems:   Hypothyroidism   Gestational diabetes mellitus, class A1   Hypertension in pregnancy, preeclampsia, severe   Asthma  Additional problems:     Discharge diagnosis: Preterm Pregnancy Delivered, Gestational Hypertension, Preeclampsia (severe), and A1GDM                                               Post partum procedures: none Augmentation: AROM, Pitocin, Cytotec, and IP Foley Complications: None  Hospital course: Induction of Labor With Vaginal Delivery   30y.o. yo G1P0101 at 30w5das admitted to the hospital 09/25/2021 for induction of labor due gestational hypertension with concern for pre-eclampsia with severe features, however fortunately over the course of monitoring her BP remained non-severe with resolution of symptoms. Mag was not started during her labor. Indication for induction: Gestational hypertension and Preeclampsia.  Patient had an uncomplicated labor course as follows: Membrane Rupture Time/Date: 10:19 AM ,09/26/2021   Delivery Method:Vaginal, Spontaneous  Episiotomy: None  Lacerations:  None  Details of delivery can be found in separate delivery note.  Patient had a routine postpartum course. Blood pressure remained within normal limits postpartum. Patient is discharged home 09/28/21.  Newborn Data: Birth date:09/26/2021  Birth time:2:52 PM  Gender:Female  Living status:Living  Apgars:9 ,9  Weight:3141 g   Magnesium Sulfate received: No BMZ received: No Rhophylac:No MMR:No T-DaP:Given prenatally Flu: No Transfusion:No  Physical exam  Vitals:    09/27/21 0555 09/27/21 1447 09/27/21 2150 09/28/21 0550  BP: 124/80 117/79 120/72 126/79  Pulse: 87 (!) 102 92 73  Resp: 17 18 17 18   Temp: 97.7 F (36.5 C) 98.2 F (36.8 C) 98.5 F (36.9 C) 98.1 F (36.7 C)  TempSrc: Oral Oral Oral Oral  SpO2: 99% 99% 99% 100%  Weight:      Height:       General: alert, cooperative, and no distress Lochia: appropriate Uterine Fundus: firm Incision: N/A DVT Evaluation: No evidence of DVT seen on physical exam. No significant calf/ankle edema. Labs: Lab Results  Component Value Date   WBC 15.2 (H) 09/26/2021   HGB 12.0 09/26/2021   HCT 35.1 (L) 09/26/2021   MCV 86.7 09/26/2021   PLT 274 09/26/2021   CMP Latest Ref Rng & Units 09/25/2021  Glucose 70 - 99 mg/dL 85  BUN 6 - 20 mg/dL 6  Creatinine 0.44 - 1.00 mg/dL 0.54  Sodium 135 - 145 mmol/L 134(L)  Potassium 3.5 - 5.1 mmol/L 3.7  Chloride 98 - 111 mmol/L 104  CO2 22 - 32 mmol/L 20(L)  Calcium 8.9 - 10.3 mg/dL 9.3  Total Protein 6.5 - 8.1 g/dL 6.6  Total Bilirubin 0.3 - 1.2 mg/dL 0.6  Alkaline Phos 38 - 126 U/L 123  AST 15 - 41 U/L 51(H)  ALT 0 - 44 U/L 15   Edinburgh Score: Edinburgh Postnatal Depression Scale Screening Tool 09/27/2021  I have been able to laugh and see the funny side of things. 0  I have looked forward with enjoyment to things. 0  I  have blamed myself unnecessarily when things went wrong. 0  I have been anxious or worried for no good reason. 0  I have felt scared or panicky for no good reason. 0  Things have been getting on top of me. 0  I have been so unhappy that I have had difficulty sleeping. 0  I have felt sad or miserable. 0  I have been so unhappy that I have been crying. 0  The thought of harming myself has occurred to me. 0  Edinburgh Postnatal Depression Scale Total 0     After visit meds:  Allergies as of 09/28/2021       Reactions   Sulfa Antibiotics         Medication List     STOP taking these medications    aspirin 81 MG EC  tablet       TAKE these medications    Accu-Chek Guide Me w/Device Kit 1 each by Does not apply route 4 (four) times daily.   acetaminophen 325 MG tablet Commonly known as: Tylenol Take 2 tablets (650 mg total) by mouth every 4 (four) hours as needed for moderate pain or mild pain (for pain scale < 4).   Doxylamine-Pyridoxine 10-10 MG Tbec Take 1 tablet by mouth at bedtime.   glucose blood test strip Use as instructed to check blood sugar 4 times daily   ibuprofen 200 MG tablet Commonly known as: ADVIL Take 3 tablets (600 mg total) by mouth every 6 (six) hours as needed for moderate pain or mild pain.   levothyroxine 50 MCG tablet Commonly known as: SYNTHROID TAKE 1 TABLET BY MOUTH EVERY DAY BEFORE BREAKFAST What changed: See the new instructions.   onetouch ultrasoft lancets Use as directed to check blood sugar 4 times daily   Pulmicort Flexhaler 180 MCG/ACT inhaler Generic drug: budesonide Inhale 2 puffs into the lungs 2 (two) times daily.   Trinate Tabs Take 1 daily         Discharge home in stable condition Infant Feeding: Breast Infant Disposition:home with mother  Discharge instruction: per After Visit Summary and Postpartum booklet. Activity: Advance as tolerated. Pelvic rest for 6 weeks.  Diet: routine diet Future Appointments: Future Appointments  Date Time Provider [place]  10/03/2021 11:10 AM [facility] NURSE [facility]  10/30/2021  1:30 PM Myrtis Ser, CNM [facility]   Follow up Visit:  Message sent by Dr Higinio Plan to [facility] on 09/26/2021:   Please schedule this patient for a In person postpartum visit in 4 weeks with the following provider: Any provider. Additional Postpartum F/U:BP check 1 week, needs 6 week TSH checked  High risk pregnancy complicated by: HTN, IUI pregnancy, and hypothyroidism on synthroid  Delivery mode: vaginal delivery  Anticipated Birth Control:  declines.   09/28/2021 Lattie Corns, MD, PGY-1,  Faculty Service   GME ATTESTATION:  I saw and evaluated the patient. After resident's evaluation patient's clinical condition changed and we ended up keeping her in the hospital for an additional day. See progress note from 10/22 for further details. New DC Summary signed on 10/23  Renard Matter, MD, MPH OB Fellow, [place] for [place] 09/30/2021 6:26 PM

## 2021-09-26 NOTE — Progress Notes (Addendum)
Labor Progress Note Anna Guzman is a 30 y.o. G1P0000 at [redacted]w[redacted]d presented for IOL for pre-ecclampsia with SF S: Feeling more pain with contractions requiring one dose of fentanyl. Resting comfortably in bed between contractions. Foley bulb fell out at 2320.  O:  BP (!) 128/92   Pulse 65   Temp 98.6 F (37 C) (Oral)   Resp 18   Ht 5\' 5"  (1.651 m)   Wt 68.1 kg   BMI 24.98 kg/m  EFM: 130/good variability/+accels/no decels  CVE: Dilation: 4 Effacement (%): 50 Station: -2 Presentation: Vertex Exam by:: Dr [email or web address]   A&P: 30 y.o. G1P0000 [redacted]w[redacted]d induced for pre-E with SF. #Labor: Progressing well. Foley bulb out but cervix still 50% effaced. Giving additional dose of Cytotec now. Consider starting pitocin if further effaced at next check. #Pain: manageable with fentanyl prn. Plans on epidural #FWB: cat 1 #GBS  pending . Giving Ancef due to PCN allergy. 1st dose at 2153 on 10/19. #A1GDM: glucose well controlled between 85-115. Continue q4hr CBG checks. #pre-E with SF: BP in mild to moderate range. Pre-E labs on admission without evidence of severe features. Vision changes have been improved since arrival to hospital. Holding magnesium for now but will initiate if vision changes return or other symptoms of severe pre-E.   11/19, MD, PGY-1, Faculty Service 1:14 AM

## 2021-09-26 NOTE — Lactation Note (Signed)
This note was copied from a baby's chart. Lactation Consultation Note  Patient Name: Anna Guzman Date: 09/26/2021 Reason for consult: Initial assessment;Mother's request;Primapara;1st time breastfeeding;Late-preterm 34-36.6wks;Maternal endocrine disorder Age:30 hours  LC assisted with latching and reviewing different positions to get a deep latch. Mom large nipples and compression stripe noted. Mom denied any pain with latch after changes made to get more depth and flange out lips.   Infant had recent feeding 3 hrs prior. Infant left STS able to latch when cues  Plan 1. To feed based on cues 8-12x 24hr period. Mom to look for signs of milk transfer.  2. If unable to latch, Mom to offer EBM via spoon.  3. I and O sheet reviewed.  All questions answered at the end of the visit.   Maternal Data Has patient been taught Hand Expression?: Yes Does the patient have breastfeeding experience prior to this delivery?: No  Feeding Mother's Current Feeding Choice: Breast Milk  LATCH Score Latch: Repeated attempts needed to sustain latch, nipple held in mouth throughout feeding, stimulation needed to elicit sucking reflex.  Audible Swallowing: Spontaneous and intermittent  Type of Nipple: Everted at rest and after stimulation  Comfort (Breast/Nipple): Soft / non-tender  Hold (Positioning): Assistance needed to correctly position infant at breast and maintain latch.  LATCH Score: 8   Lactation Tools Discussed/Used    Interventions Interventions: Breast feeding basics reviewed;Support pillows;Education;Assisted with latch;Position options;Skin to skin;Expressed milk;Breast massage;Breast compression;Adjust Charity fundraiser;Infant Driven Feeding Algorithm education  Discharge Pump: Personal  Consult Status Consult Status: Follow-up Date: 09/27/21 Follow-up type: In-patient    Anna Guzman  Nicholson-Springer 09/26/2021, 8:57 PM

## 2021-09-26 NOTE — Progress Notes (Signed)
Jennifermarie Franzen is a 30 y.o. G1P0000 at [redacted]w[redacted]d for IOL due to severe PreE with SF  Subjective: Pt is comfortable with her epidural.   Objective: BP 127/71   Pulse (!) 126   Temp 98 F (36.7 C) (Oral)   Resp 16   Ht 5\' 5"  (1.651 m)   Wt 68.1 kg   SpO2 98%   BMI 24.98 kg/m  No intake/output data recorded. No intake/output data recorded.  FHT:  FHR: 170s, variability: periods of minimal and moderate variability,  accelerations: absent,  decelerations: she had recurrent late decels despite terb, position changes, and fluids.  UC:   regular, every 1-2 minutes SVE:   7/100/-1, AROM to clear fluid  Labs: Lab Results  Component Value Date   WBC 15.2 (H) 09/26/2021   HGB 12.0 09/26/2021   HCT 35.1 (L) 09/26/2021   MCV 86.7 09/26/2021   PLT 274 09/26/2021    Assessment / Plan: Initially an induction now on nothing to induce and contracting spontaneously. Reviewed plan of care with patient and support person. We discussed fetal tracing and indications for delivery. We will try another small fluid bolus and AROM to see if this improves tracing. Thus far, lates have stopped and tachycardia has persisted but we will continue to monitor. Maternal temperature is normal and AROM just performed, no evidence of infection.  We discussed if persistent late decels, we would recommend delivery by c-section unless impending delivery.   Labor: Progressing normally Preeclampsia:  no signs or symptoms of toxicity Fetal Wellbeing:  Category II Pain Control:  Epidural I/D:   GBS unknown on Ancef Anticipated MOD:  NSVD  09/28/2021 09/26/2021, 10:34 AM

## 2021-09-26 NOTE — Progress Notes (Signed)
Anna Guzman is a 30 y.o. G1P0000 at [redacted]w[redacted]d for IOL due to severe PreE with SF  Subjective: Pt is comfortable with her epidural.   Objective: BP 123/66   Pulse (!) 135   Temp 98 F (36.7 C) (Oral)   Resp 16   Ht 5\' 5"  (1.651 m)   Wt 68.1 kg   SpO2 98%   BMI 24.98 kg/m  No intake/output data recorded. No intake/output data recorded.  FHT:  160s most recently from the 170s, moderate variability, no decels UC:   regular, every 2-50min s/p terb  SVE:   8/100/0, fluid is bloody, DOP  Labs: Lab Results  Component Value Date   WBC 15.2 (H) 09/26/2021   HGB 12.0 09/26/2021   HCT 35.1 (L) 09/26/2021   MCV 86.7 09/26/2021   PLT 274 09/26/2021    Assessment / Plan: Discussed likely marginal abruption without fetal distress at this time. Discussed if we see signs of fetal distress, we will move to c-section and we will have a low threshold to do so. Patient and partner are both in agreement with this plan.   Labor: Progressing normally Preeclampsia:  no signs or symptoms of toxicity Fetal Wellbeing:  Cat 1-2, depending on stretch. Has periods of minimal variability and periods or tachycardia but has long periods of moderate variability. Pain Control:  Epidural I/D:   GBS unknown on Ancef Anticipated MOD:  NSVD  09/28/2021 09/26/2021, 11:50 AM

## 2021-09-26 NOTE — Progress Notes (Signed)
Anna Guzman is a 30 y.o. G1P0000 at [redacted]w[redacted]d  admitted for induction of labor for preE with SF (symptoms of headaches and blurry vision).  Subjective: Feels contractions every 2-3 mins. Headache resolved.  Objective: BP 134/90   Pulse 79   Temp 98.8 F (37.1 C) (Oral)   Resp 18   Ht 5\' 5"  (1.651 m)   Wt 68.1 kg   BMI 24.98 kg/m  No intake/output data recorded. No intake/output data recorded.  FHT:  FHR: 150 bpm, variability: moderate,  accelerations:  Present,  decelerations:  Absent UC:   irregular, every 2-3 minutes SVE:   Dilation: 5 Effacement (%): 70 Station: -2 Exam by:: Dr [email or web address]  Labs: Lab Results  Component Value Date   WBC 15.2 (H) 09/26/2021   HGB 12.0 09/26/2021   HCT 35.1 (L) 09/26/2021   MCV 86.7 09/26/2021   PLT 274 09/26/2021    Assessment / Plan:  induction of labor for preE with SF (symptoms of headaches and blurry vision).  Labor:  cervix further effaced after 2nd cytotec, starting pitocin now. Station continues to be -2, will plan for position changes including peanut ball to facilitate descent Preeclampsia:  no signs or symptoms of toxicity and intake and ouput balanced. BP in 130s/80s-90s Fetal Wellbeing:  Category I Pain Control:  IV pain meds and then planning epidural I/D:   GBS unknown, Ancef Anticipated MOD:  NSVD   #A1GDM Last BG 93, continue q4hr check  09/28/2021, MD, MPH OB Fellow, Faculty Practice

## 2021-09-26 NOTE — Anesthesia Procedure Notes (Signed)
Epidural Patient location during procedure: OB Start time: 09/26/2021 6:53 AM End time: 09/26/2021 7:00 AM  Staffing Anesthesiologist: Marcene Duos, MD Performed: anesthesiologist   Preanesthetic Checklist Completed: patient identified, IV checked, site marked, risks and benefits discussed, surgical consent, monitors and equipment checked, pre-op evaluation and timeout performed  Epidural Patient position: sitting Prep: DuraPrep and site prepped and draped Patient monitoring: continuous pulse ox and blood pressure Approach: midline Location: L4-L5 Injection technique: LOR air  Needle:  Needle type: Tuohy  Needle gauge: 17 G Needle length: 9 cm and 9 Needle insertion depth: 5 cm cm Catheter type: closed end flexible Catheter size: 19 Gauge Catheter at skin depth: 10 cm Test dose: negative  Assessment Events: blood not aspirated, injection not painful, no injection resistance, no paresthesia and negative IV test

## 2021-09-26 NOTE — Anesthesia Preprocedure Evaluation (Signed)
Anesthesia Evaluation  Patient identified by MRN, date of birth, ID band Patient awake    Reviewed: Allergy & Precautions, NPO status , Patient's Chart, lab work & pertinent test results  Airway Mallampati: II  TM Distance: >3 FB     Dental   Pulmonary asthma ,    breath sounds clear to auscultation       Cardiovascular hypertension,  Rhythm:Regular Rate:Normal     Neuro/Psych negative neurological ROS     GI/Hepatic negative GI ROS, Neg liver ROS,   Endo/Other  diabetesHypothyroidism   Renal/GU negative Renal ROS     Musculoskeletal   Abdominal   Peds  Hematology negative hematology ROS (+)   Anesthesia Other Findings   Reproductive/Obstetrics (+) Pregnancy                             Lab Results  Component Value Date   WBC 15.2 (H) 09/26/2021   HGB 12.0 09/26/2021   HCT 35.1 (L) 09/26/2021   MCV 86.7 09/26/2021   PLT 274 09/26/2021   Lab Results  Component Value Date   CREATININE 0.54 09/25/2021   BUN 6 09/25/2021   NA 134 (L) 09/25/2021   K 3.7 09/25/2021   CL 104 09/25/2021   CO2 20 (L) 09/25/2021    Anesthesia Physical Anesthesia Plan  ASA: 2  Anesthesia Plan: Epidural   Post-op Pain Management:    Induction:   PONV Risk Score and Plan: Treatment may vary due to age or medical condition  Airway Management Planned: Natural Airway  Additional Equipment:   Intra-op Plan:   Post-operative Plan:   Informed Consent: I have reviewed the patients History and Physical, chart, labs and discussed the procedure including the risks, benefits and alternatives for the proposed anesthesia with the patient or authorized representative who has indicated his/her understanding and acceptance.       Plan Discussed with:   Anesthesia Plan Comments:         Anesthesia Quick Evaluation

## 2021-09-26 NOTE — Progress Notes (Addendum)
Labor Progress Note Anna Guzman is a 30 y.o. G1P0000 at [redacted]w[redacted]d presented for severe preeclampsia  S: Pt tolerating labor.  Denies feeling in pelvic pressure or urge to push.  Cervix checked, clear fluid noted.  No frank bleeding.  O:  BP (!) 106/54   Pulse (!) 138   Temp 99 F (37.2 C) (Oral)   Resp 16   Ht 5\' 5"  (1.651 m)   Wt 68.1 kg   SpO2 98%   BMI 24.98 kg/m  EFM: baseline 160s/previously minimal variability, now moderate after check/accels noted, no decels  CVE: Dilation: 8 Effacement (%): 100 Station: 0 Presentation: Vertex Exam by:: duncan  SVE: rim/C/0 per [email or web address]  A&P: 30 y.o. G1P0000 [redacted]w[redacted]d preeclampsia with severe features Aware of possibility of marginal abruption Discussed with patient and nursing staff.  Strip currently category 1 With continued progression, continue trial of labor Continue MgSO4 and GBS prophylaxis ANSVD  [redacted]w[redacted]d, MD 12:39 PM   Error in above, pt is not on magnesium sulfate

## 2021-09-27 LAB — CERVICOVAGINAL ANCILLARY ONLY
Chlamydia: NEGATIVE
Comment: NEGATIVE
Comment: NORMAL
Neisseria Gonorrhea: NEGATIVE

## 2021-09-27 NOTE — Progress Notes (Addendum)
POSTPARTUM PROGRESS NOTE  Subjective: Anna Guzman is a 30 y.o. G1P0101 s/p VD at [redacted]w[redacted]d  She reports she doing well. No acute events overnight. She denies any problems with ambulating, voiding or po intake. Denies nausea or vomiting. She has passed flatus. She has not had bowel movement. Pain is well controlled.  Lochia is Minimal. Denies active headache currently. Her headache yesterday afternoon resolved with tylenol.   Objective: BP 124/80 (BP Location: Right Arm)   Pulse 87   Temp 97.7 F (36.5 C) (Oral)   Resp 17   Ht 5' 5"  (1.651 m)   Wt 68.1 kg   SpO2 99%   Breastfeeding Unknown   BMI 24.98 kg/m   Physical Exam:  General: alert, cooperative and no distress Chest: CTAB, no respiratory distress Abdomen: soft, non-tender  Uterine Fundus: firm, appropriately tender Extremities: No calf swelling, tenderness, or edema  Recent Labs    09/25/21 1527 09/26/21 0416  HGB 12.3 12.0  HCT 37.1 35.1*    Assessment/Plan: Anna Realiis a 30y.o. G1P0101 s/p VD at 384w5dor gHTN which progressed to preE w/ SF (BP + HA).   LOS: 2 days   Routine Postpartum Care: Doing well, pain well-controlled.  -- Continue routine care, lactation support  -- Contraception: no method -- Feeding: breast -- Circumcision: yes -- A1GDM: CBG 93-115, no further tx required -- preE w/ SF: continue to monitor BP  Dispo: Plan for discharge today or tomorrow, depending on baby status (waiting for bowel movement).  Anna GuilesDO 09/27/2021, 8:38 AM PGY-1, [facility]  I personally saw and evaluated the patient, performing the key elements of the service. I developed and verified the management plan that is described in the resident's/student's note, and I agree with the content with my edits above. VSS, HRR&R, Resp unlabored, Legs neg.  FrNigel BertholdCNM 10/03/2021 9:30 AM

## 2021-09-27 NOTE — Anesthesia Postprocedure Evaluation (Signed)
Anesthesia Post Note  Patient: Anna Guzman  Procedure(s) Performed: AN AD HOC LABOR EPIDURAL     Patient location during evaluation: Mother Baby Anesthesia Type: Epidural Level of consciousness: awake and alert Pain management: pain level controlled Vital Signs Assessment: post-procedure vital signs reviewed and stable Respiratory status: spontaneous breathing, nonlabored ventilation and respiratory function stable Cardiovascular status: stable Postop Assessment: no headache, no backache and epidural receding Anesthetic complications: no   No notable events documented.  Last Vitals:  Vitals:   09/27/21 0205 09/27/21 0555  BP: 110/67 124/80  Pulse: 78 87  Resp: 18 17  Temp: 36.7 C 36.5 C  SpO2: 99% 99%    Last Pain:  Vitals:   09/27/21 0555  TempSrc: Oral  PainSc: 3    Pain Goal: Patients Stated Pain Goal: 4 (09/26/21 1800)                 [facility]ica Records

## 2021-09-27 NOTE — Lactation Note (Signed)
This note was copied from a baby's chart. Lactation Consultation Note  Patient Name: Anna Guzman [name] Date: 09/27/2021 Reason for consult: Follow-up assessment;Late-preterm 34-36.6wks;Primapara;1st time breastfeeding Age:30 hours   P1 mother whose infant is now 23 hours old.  This is a LPTI at 36+5 weeks with a CGA of 36+6 weeks.    Mother was hand expressing colostrum when I arrived.  Baby has been sleepy.  Offered to demonstrate curved tip syringe since mother had already obtained 3 mls on the spoon.  Baby easily consumed the colostrum.  Assisted to latch in the football hold and observed him feeding for 5 minutes with gentle stimulation.  Mother placed him STS after feeding and he fell asleep.  Reviewed the LPTI policy with parents.  Discussed beginning to use the DEBP if mother not able to provide colostrum or baby does not feed effectively at the breast.  Mother will call if she desires to be set up with the DEBP.  Encouraged her to call for lactation assistance as needed.  Mother has a DEBP for home use.  Father involved and assisting mother with care.  RN updated.   Maternal Data Has patient been taught Hand Expression?: Yes Does the patient have breastfeeding experience prior to this delivery?: No  Feeding Mother's Current Feeding Choice: Breast Milk  LATCH Score Latch: Repeated attempts needed to sustain latch, nipple held in mouth throughout feeding, stimulation needed to elicit sucking reflex.  Audible Swallowing: None  Type of Nipple: Everted at rest and after stimulation  Comfort (Breast/Nipple): Soft / non-tender  Hold (Positioning): Assistance needed to correctly position infant at breast and maintain latch.  LATCH Score: 6   Lactation Tools Discussed/Used    Interventions Interventions: Breast feeding basics reviewed;Assisted with latch;Skin to skin;Breast massage;Hand express;Breast compression;Position options;Support pillows;Adjust  position;Education  Discharge Pump: Personal WIC Program: No  Consult Status Consult Status: Follow-up Date: 09/28/21 Follow-up type: In-patient    Anna Guzman 09/27/2021, 7:14 AM

## 2021-09-28 ENCOUNTER — Inpatient Hospital Stay (HOSPITAL_COMMUNITY): Payer: BC Managed Care – PPO

## 2021-09-28 LAB — COMPREHENSIVE METABOLIC PANEL
ALT: 15 U/L (ref 0–44)
AST: 28 U/L (ref 15–41)
Albumin: 2.4 g/dL — ABNORMAL LOW (ref 3.5–5.0)
Alkaline Phosphatase: 91 U/L (ref 38–126)
Anion gap: 9 (ref 5–15)
BUN: 8 mg/dL (ref 6–20)
CO2: 20 mmol/L — ABNORMAL LOW (ref 22–32)
Calcium: 8.9 mg/dL (ref 8.9–10.3)
Chloride: 108 mmol/L (ref 98–111)
Creatinine, Ser: 0.65 mg/dL (ref 0.44–1.00)
GFR, Estimated: >60 mL/min (ref >60)
Glucose, Bld: 105 mg/dL — ABNORMAL HIGH (ref 70–99)
Potassium: 3.6 mmol/L (ref 3.5–5.1)
Sodium: 137 mmol/L (ref 135–145)
Total Bilirubin: 0.4 mg/dL (ref 0.3–1.2)
Total Protein: 5.3 g/dL — ABNORMAL LOW (ref 6.5–8.1)

## 2021-09-28 LAB — CBC
HCT: 30.4 % — ABNORMAL LOW (ref 36.0–46.0)
Hemoglobin: 10.1 g/dL — ABNORMAL LOW (ref 12.0–15.0)
MCH: 29.5 pg (ref 26.0–34.0)
MCHC: 33.2 g/dL (ref 30.0–36.0)
MCV: 88.9 fL (ref 80.0–100.0)
Platelets: 246 10*3/uL (ref 150–400)
RBC: 3.42 MIL/uL — ABNORMAL LOW (ref 3.87–5.11)
RDW: 13.5 % (ref 11.5–15.5)
WBC: 13 10*3/uL — ABNORMAL HIGH (ref 4.0–10.5)
nRBC: 0 % (ref 0.0–0.2)

## 2021-09-28 LAB — CULTURE, BETA STREP (GROUP B ONLY): Strep Gp B Culture: POSITIVE — AB

## 2021-09-28 MED ORDER — NIFEDIPINE ER OSMOTIC RELEASE 30 MG PO TB24
30.0000 mg | ORAL_TABLET | Freq: Every day | ORAL | Status: DC
  Administered 2021-09-28 – 2021-09-29 (×2): 30 mg via ORAL
  Filled 2021-09-28 (×2): qty 1

## 2021-09-28 MED ORDER — ENOXAPARIN SODIUM 40 MG/0.4ML IJ SOSY
40.0000 mg | PREFILLED_SYRINGE | INTRAMUSCULAR | Status: DC
  Administered 2021-09-28: 40 mg via SUBCUTANEOUS
  Filled 2021-09-28: qty 0.4

## 2021-09-28 MED ORDER — BUDESONIDE 0.25 MG/2ML IN SUSP
0.2500 mg | Freq: Two times a day (BID) | RESPIRATORY_TRACT | Status: DC
  Administered 2021-09-28 – 2021-09-29 (×2): 0.25 mg via RESPIRATORY_TRACT
  Filled 2021-09-28 (×2): qty 2

## 2021-09-28 MED ORDER — IBUPROFEN 200 MG PO TABS: 600.0000 mg | ORAL_TABLET | Freq: Four times a day (QID) | ORAL | 0 refills | Status: DC | PRN

## 2021-09-28 MED ORDER — ACETAMINOPHEN 325 MG PO TABS: 650.0000 mg | ORAL_TABLET | ORAL | 0 refills | Status: DC | PRN

## 2021-09-28 MED ORDER — LABETALOL HCL 200 MG PO TABS
200.0000 mg | ORAL_TABLET | Freq: Two times a day (BID) | ORAL | Status: DC
  Administered 2021-09-28: 200 mg via ORAL
  Filled 2021-09-28: qty 1

## 2021-09-28 MED ORDER — LABETALOL HCL 5 MG/ML IV SOLN: 40.0000 mg | INTRAVENOUS | Status: DC | PRN

## 2021-09-28 MED ORDER — IOHEXOL 350 MG/ML SOLN
100.0000 mL | Freq: Once | INTRAVENOUS | Status: AC | PRN
  Administered 2021-09-28: 100 mL via INTRAVENOUS

## 2021-09-28 MED ORDER — LABETALOL HCL 5 MG/ML IV SOLN: 20.0000 mg | INTRAVENOUS | Status: DC | PRN

## 2021-09-28 MED ORDER — LABETALOL HCL 5 MG/ML IV SOLN: 80.0000 mg | INTRAVENOUS | Status: DC | PRN

## 2021-09-28 MED ORDER — HYDRALAZINE HCL 20 MG/ML IJ SOLN: 10.0000 mg | INTRAMUSCULAR | Status: DC | PRN

## 2021-09-28 NOTE — Progress Notes (Signed)
POSTPARTUM PROGRESS NOTE  Subjective: Kebra Lowrimore is a 30 y.o. G1P0101 PPD#2 s/p SVD at [redacted]w[redacted]d.   Patient was planning to DC today, however when went in to round on patient, having a headache, chest pressure, shortness of breath.  Pain is well controlled.  Lochia is scant.  Objective: Initia BP 160s/110s  Blood pressure (!) 154/91, pulse 94, temperature 98.5 F (36.9 C), temperature source Oral, resp. rate 20, height 5\' 5"  (1.651 m), weight 68.1 kg, SpO2 100 %, unknown if currently breastfeeding.  Physical Exam:  General: appears uncomfortable, tearful Chest: no wheezing, faint breath sounds, no crackles Abdomen: soft, non-tender  Uterine Fundus: firm, appropriately tender Extremities: No calf swelling or tenderness  No edema  Recent Labs    09/25/21 1527 09/26/21 0416  HGB 12.3 12.0  HCT 37.1 35.1*    Assessment/Plan: Larita Deremer is a 30 y.o. G1P0101 PPD#2 s/p SVD at [redacted]w[redacted]d after IOL for PreE w/SF (HA and vision changes).   Initially planned to DC today however given above symptoms plan below:  #PrE w/SF now with elevated BP - Check CBC and CMP - IV was already pulled so giving one time dose of PO Labetalol 200mg  and replacing IV - Start Procardia 30mg  Daily - q15 BP for 2 hrs, if elevated to severe range will give IV Labetalol and start Mg  #Shortness of breath and chest pain On exam no wheezing and breath sounds distant. Initial Pulse tachycardic to 110s, now in 90s. No evidence of unilateral edema however given increased risk for PE in post partum state will get CTPA and give dose of Lovenox now. Patient also with hx of asthma and takes BID Budesonide inhaler but not getting while in PP - CT PA ordered - Budesonide nebulizer ordered - Lovenox PPX dose given  Dispo: Will keep in hospital today given above clinical status change. Plan for discharge once clinical improvement,  potentially on PPD#3.  [redacted]w[redacted]d, MD, MPH OB Fellow, [facility] for [facility]

## 2021-09-28 NOTE — Lactation Note (Signed)
This note was copied from a baby's chart. Lactation Consultation Note  Patient Name: Anna Guzman Date: 09/28/2021 Reason for consult: Follow-up assessment Age:30 hours   P1 mother whose infant is now 29 hours old.  This is a LPTI at 36+5 weeks with a CGA of 37+0 weeks.  Mother has been exclusively breast feeding.  Parents commented on how well baby has been progressing since yesterday's visit.  Mother has been latching and feeding every 2-3 hours.  "Anna Guzman" shows readiness, is eager to feed and mother has heard audible swallows with feedings.  Multiple voids/stools noted.  Last LATCH score was a 9.  Yesterday we discussed beginning to pump with her DEBP if "[name]" does not feed well or begins to lose too much weight.  Mother will continue to monitor and use her DEBP as needed.  "Anna Guzman" will be getting a circumcision today; discussed possible sleepiness after the circumcision for many hours.  Encouraged parents to awaken him and feed EBM if he does not awaken and breast feed well.  I feel confident these parents will be able to follow through with this plan.  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  Follow up pediatrician's visit will be Monday.   Maternal Data    Feeding    LATCH Score                    Lactation Tools Discussed/Used    Interventions    Discharge Discharge Education: Engorgement and breast care Pump: Personal  Consult Status Consult Status: Complete Date: 09/28/21 Follow-up type: Call as needed    Irene Pap Benjiman Sedgwick 09/28/2021, 8:55 AM

## 2021-09-28 NOTE — Lactation Note (Signed)
This note was copied from a baby's chart. Lactation Consultation Note  Patient Name: Anna Guzman [id number] Date: 09/28/2021 Reason for consult: Follow-up assessment Age:30 years  I received a phone call that the Mom of this infant had been told to pump and dump for 24 hrs after having received her CT scan.   The contrast in question is iohexol. Per LactMed NIH; Thomas Hale's "Medications & Mother's Milk" (2023); and ACR Manual on Contrast Media (2022), p. 106. Per those sources, Mom does not need to pump & dump for 24 hrs after receiving iohexol and it is safe to resume breastfeeding.    I informed Mom of what my resources said. Dr. Ephriam Jenkins was in the room and in agreement. Philbert Riser, RN was made aware that it was safe for infant to be put to the breast.   Lurline Hare [facility] 09/28/2021, 1:49 PM

## 2021-09-28 NOTE — Lactation Note (Addendum)
This note was copied from a baby's chart. Lactation Consultation Note  Patient Name: Anna Guzman IWLNL'G Date: 09/28/2021 Reason for consult: Follow-up assessment Age:30 hours   P1 mother whose infant is now 86 hours old.  This is a LPTI at 36+5 weeks with a CGA of 37+0 weeks.  Mother has been exclusively breast feeding.  NP asked for a latch observation.    When I arrived, mother had already been feeding "Anna Guzman" for 20 minutes.  He appeared to have a wide gape, good jaw extensions and flanged lips.  Breast tissue had good movement and occasional audible swallows noted.  Mother denied pain with feeding.  Observed his upper extremities to be limp after an additional 5 minutes of feeding, indicating baby was satiated. Multiple voids/stools noted.  Devised a feeding plan for after discharge to include feeding at least every three hours or sooner if "Anna Guzman" shows cues.  Suggested mother post pump after every feeding for 15 minutes and feed back any EBM she obtains to baby until the first pediatrician's follow up visit on Monday.  Discussed how the extra breast stimulation will help encourage a full milk supply.  Father offered to delay circumcision until he is feeding well every time and asked my opinion about this.  I feel like this is a good idea and will allow mom and baby more active time now to work on breast feeding.  Parents desire a discharge home today.  NP updated.  Parents will call for any questions/concerns.   Maternal Data    Feeding    LATCH Score Latch: Grasps breast easily, tongue down, lips flanged, rhythmical sucking.  Audible Swallowing: Spontaneous and intermittent  Type of Nipple: Everted at rest and after stimulation  Comfort (Breast/Nipple): Soft / non-tender  Hold (Positioning): Assistance needed to correctly position infant at breast and maintain latch.  LATCH Score: 9   Lactation Tools Discussed/Used    Interventions Interventions: Breast  feeding basics reviewed;Skin to skin;Breast compression;Education  Discharge Discharge Education: Engorgement and breast care Pump: Personal  Consult Status Consult Status: Complete Date: 09/28/21 Follow-up type: Call as needed    Irene Pap Kayleen Alig 09/28/2021, 10:33 AM

## 2021-09-29 MED ORDER — NIFEDIPINE ER 30 MG PO TB24: 30.0000 mg | ORAL_TABLET | Freq: Every day | ORAL | 0 refills | Status: DC

## 2021-09-29 NOTE — Lactation Note (Signed)
This note was copied from a baby's chart. Lactation Consultation Note  Patient Name: Anna Guzman [id number]'M Date: 09/29/2021 Reason for consult: Follow-up assessment Age:30 hours   Follow Up Lactation Consult:  Mother requested assistance.  She had finished pumping for the first time and was able to express 9 mls of colostrum.  8 mls from the right breast and 1 ml from the left breast (the breast he had fed on).  Demonstrated how to feed this back using the curved tip syringe.  Praised mother for her efforts and suggested she continue to pump and feed back any EBM until she returns to the pediatrician for the first visit.  The pediatrician will be able to make further recommendations depending on his breast feeding status at that time.  Parents pleased with his progress.  RN in room at the end of my visit and updated.  NP updated.   Maternal Data    Feeding Mother's Current Feeding Choice: Breast Milk  LATCH Score                    Lactation Tools Discussed/Used Tools: Pump;Flanges;Coconut oil Flange Size: 24 Breast pump type: Double-Electric Breast Pump;Manual Pump Education: Setup, frequency, and cleaning;Milk Storage Reason for Pumping: Supplementation for LPTI Pumping frequency: Every three hours  Interventions Interventions: Education  Discharge Pump: DEBP;Manual;Personal  Consult Status Consult Status: Complete Date: 09/29/21 Follow-up type: Call as needed    Kishan [place] R Ova Meegan 09/29/2021, 9:18 AM

## 2021-09-29 NOTE — Lactation Note (Signed)
This note was copied from a baby's chart. Lactation Consultation Note  Patient Name: Anna Guzman GYBWL'S Date: 09/29/2021   Age:30 hours   Lactation Follow Up Note:  Spoke with RN, mother is not interested in supplementing or pumping.  Baby has a 9% weight loss this morning.   Maternal Data    Feeding    LATCH Score                    Lactation Tools Discussed/Used    Interventions    Discharge    Consult Status      [name] 09/29/2021, 5:05 AM

## 2021-09-29 NOTE — Discharge Summary (Signed)
Postpartum Discharge Summary    Patient Name: Anna Guzman DOB: [birth year] MRN: [id number]  Date of admission: 09/25/2021 Delivery date:09/26/2021  Delivering provider: Patriciaann Clan  Date of discharge: 09/29/2021  Admitting diagnosis: Hypertension in pregnancy, preeclampsia, severe [O14.10] Intrauterine pregnancy: [redacted]w[redacted]d    Secondary diagnosis:  Active Problems:   Hypothyroidism   Gestational diabetes mellitus, class A1   Hypertension in pregnancy, preeclampsia, severe   Asthma  Additional problems:     Discharge diagnosis: Preterm Pregnancy Delivered, Gestational Hypertension, Preeclampsia (severe), and A1GDM                                               Post partum procedures: none Augmentation: AROM, Pitocin, Cytotec, and IP Foley Complications: None  Hospital course: Induction of Labor With Vaginal Delivery   30y.o. yo G1P0101 at [id number] admitted to the hospital 09/25/2021 for induction of labor due gestational hypertension with concern for pre-eclampsia with severe features (of headache and vision changes). Her headaches and vision changes had resolved in early part of her admission and her BP during labor was normotensive to mild range so Mag was not started during her labor. Indication for induction: Gestational hypertension and Preeclampsia.  Patient had an uncomplicated labor course as follows: Membrane Rupture Time/Date: 10:19 AM ,09/26/2021   Delivery Method:Vaginal, Spontaneous  Episiotomy: None  Lacerations:  None  Details of delivery can be found in separate delivery note.  In the post partum period, patient initially felt well and her BP remain normotensive. However, on PPD#2 she had an episode of  headache, chest pressure, and shortness of breath. Her blood pressure at that time was 150s/90s and therefore PO Procardia was started. Since she also reported chest pressure, a CT PA was done to assess for PE which was negative. She was monitored in the  hospital for an extra day till PPD#3 and her BP were normotensive and she was discharged home on  09/29/21 with Procardia [id number], return precautions, and plan for 1 week BP check in office.   Newborn Data: Birth date:09/26/2021  Birth time:2:52 PM  Gender:Female  Living status:Living  Apgars:9 ,9  Weight:3141 g   Magnesium Sulfate received: No BMZ received: No Rhophylac:No MMR:No T-DaP:Given prenatally Flu: No Transfusion:No  Physical exam  Vitals:   09/28/21 2330 09/29/21 0245 09/29/21 0539 09/29/21 0910  BP: 114/76 134/82 122/84 127/82  Pulse: 97 90 80 99  Resp: 18 18 18 18   Temp: 98 F (36.7 C)  98 F (36.7 C) 98.6 F (37 C)  TempSrc: Oral  Oral Oral  SpO2: 100% 100% 100%   Weight:      Height:       General: alert, cooperative, and no distress Lochia: appropriate Uterine Fundus: firm Incision: N/A DVT Evaluation: No evidence of DVT seen on physical exam. No significant calf/ankle edema. Labs: Lab Results  Component Value Date   WBC 13.0 (H) 09/28/2021   HGB 10.1 (L) 09/28/2021   HCT 30.4 (L) 09/28/2021   MCV 88.9 09/28/2021   PLT 246 09/28/2021   CMP Latest Ref Rng & Units 09/28/2021  Glucose 70 - 99 mg/dL 105(H)  BUN 6 - 20 mg/dL 8  Creatinine 0.44 - 1.00 mg/dL 0.65  Sodium 135 - 145 mmol/L 137  Potassium 3.5 - 5.1 mmol/L 3.6  Chloride 98 - 111 mmol/L 108  CO2 22 - 32 mmol/L 20(L)  Calcium 8.9 - 10.3 mg/dL 8.9  Total Protein 6.5 - 8.1 g/dL 5.3(L)  Total Bilirubin 0.3 - 1.2 mg/dL 0.4  Alkaline Phos 38 - 126 U/L 91  AST 15 - 41 U/L 28  ALT 0 - 44 U/L 15   Edinburgh Score: Edinburgh Postnatal Depression Scale Screening Tool 09/27/2021  I have been able to laugh and see the funny side of things. 0  I have looked forward with enjoyment to things. 0  I have blamed myself unnecessarily when things went wrong. 0  I have been anxious or worried for no good reason. 0  I have felt scared or panicky for no good reason. 0  Things have been getting on  top of me. 0  I have been so unhappy that I have had difficulty sleeping. 0  I have felt sad or miserable. 0  I have been so unhappy that I have been crying. 0  The thought of harming myself has occurred to me. 0  Edinburgh Postnatal Depression Scale Total 0     After visit meds:  Allergies as of 09/29/2021       Reactions   Sulfa Antibiotics         Medication List     STOP taking these medications    aspirin 81 MG EC tablet       TAKE these medications    Accu-Chek Guide Me w/Device Kit 1 each by Does not apply route 4 (four) times daily.   acetaminophen 325 MG tablet Commonly known as: Tylenol Take 2 tablets (650 mg total) by mouth every 4 (four) hours as needed for moderate pain or mild pain (for pain scale < 4).   Doxylamine-Pyridoxine 10-10 MG Tbec Take 1 tablet by mouth at bedtime.   glucose blood test strip Use as instructed to check blood sugar 4 times daily   ibuprofen 200 MG tablet Commonly known as: ADVIL Take 3 tablets (600 mg total) by mouth every 6 (six) hours as needed for moderate pain or mild pain.   levothyroxine 50 MCG tablet Commonly known as: SYNTHROID TAKE 1 TABLET BY MOUTH EVERY DAY BEFORE BREAKFAST What changed: See the new instructions.   NIFEdipine 30 MG 24 hr tablet Commonly known as: ADALAT CC Take 1 tablet (30 mg total) by mouth daily. Start taking on: September 30, 2021   onetouch ultrasoft lancets Use as directed to check blood sugar 4 times daily   Pulmicort Flexhaler 180 MCG/ACT inhaler Generic drug: budesonide Inhale 2 puffs into the lungs 2 (two) times daily.   Trinate Tabs Take 1 daily         Discharge home in stable condition Infant Feeding: Breast Infant Disposition:home with mother  Discharge instruction: per After Visit Summary and Postpartum booklet. Activity: Advance as tolerated. Pelvic rest for 6 weeks.  Diet: routine diet Future Appointments: Future Appointments  Date Time Provider [place]  10/03/2021 11:10 AM [facility] NURSE [facility]  10/30/2021  1:30 PM Myrtis Ser, CNM [facility]   Follow up Visit:  Message sent by Dr Higinio Plan to [facility] on 09/26/2021:   Please schedule this patient for a In person postpartum visit in 4 weeks with the following provider: Any provider. Additional Postpartum F/U:BP check 1 week, needs 6 week TSH checked  High risk pregnancy complicated by: HTN, IUI pregnancy, and hypothyroidism on synthroid  Delivery mode: vaginal delivery  Anticipated Birth Control:  declines.  Renard Matter, MD, MPH OB  Fellow, Water engineer

## 2021-09-29 NOTE — Lactation Note (Signed)
This note was copied from a baby's chart. Lactation Consultation Note  Patient Name: Anna Guzman SVXBL'T Date: 09/29/2021 Reason for consult: Follow-up assessment;Primapara;1st time breastfeeding;Late-preterm 34-36.6wks Age:30 hours   P1 mother whose infant is now 7 hours old.  This is a LPTI at 36+5 weeks with a CGA of 37+0 weeks.  Baby has a 9% weight loss this morning.  Mother has been exclusively breast feeding; no desire to supplement or pump.  Family was ready for discharge yesterday until mother started to have some unusual symptoms requiring medical attention.  She stated that she feels better this morning but will get dizzy when she stands up.    Arrived to find "[name]" finishing a 30 minute breast feeding session.  According to parents he continues to breast feed well and is content after feeds.  Mother hears audible swallows with feeding and denies pain with latching.  He has started feeding from both breasts now. Mother's nipples are rounded and without trauma after feeding.  "[name]" has had 16 voids and 20 stools since birth.  Stools are transitioning to a green color now.  Father placed him in the bassinet and he fell asleep.  Suggested mother pump with the DEBP until discharge today (assuming she will still be discharged).  Discussed the benefits of pumping and mother willing even though she is exhausted.  She did not wish to pump last night due to her condition.  Pump parts, assembly and cleaning reviewed.  #24 flange size is appropriate.  Mother will feed back any EBM she obtains to "[name]."  RN updated.   Maternal Data    Feeding Mother's Current Feeding Choice: Breast Milk  LATCH Score                    Lactation Tools Discussed/Used Tools: Pump;Flanges;Coconut oil Flange Size: 24 Breast pump type: Double-Electric Breast Pump;Manual Pump Education: Setup, frequency, and cleaning;Milk Storage Reason for Pumping: Supplementation for LPTI Pumping  frequency: Every three hours  Interventions Interventions: Education  Discharge Pump: DEBP;Manual;Personal  Consult Status Consult Status: Complete Date: 09/29/21 Follow-up type: Call as needed    Tahji [place] R Raychelle Hudman 09/29/2021, 7:56 AM

## 2021-09-29 NOTE — Plan of Care (Signed)
Discharge instructions given, pt receptive. Received after visit summary.

## 2021-09-30 LAB — SURGICAL PATHOLOGY

## 2021-10-03 ENCOUNTER — Telehealth (INDEPENDENT_AMBULATORY_CARE_PROVIDER_SITE_OTHER): Payer: BC Managed Care – PPO

## 2021-10-03 VITALS — BP 131/81 | HR 120

## 2021-10-03 DIAGNOSIS — O1413 Severe pre-eclampsia, third trimester: Secondary | ICD-10-CM

## 2021-10-03 DIAGNOSIS — Z0130 Encounter for examination of blood pressure without abnormal findings: Secondary | ICD-10-CM

## 2021-10-03 NOTE — Progress Notes (Addendum)
   NURSE VISIT- BLOOD PRESSURE CHECK  SUBJECTIVE:  Anna Guzman is a 30 y.o. G28P0101 female here for BP check. She is postpartum, delivery date 09/26/21     HYPERTENSION ROS:  Postpartum:  Severe headaches that don't go away with tylenol/other medicines: No  Visual changes (seeing spots/double/blurred vision) No  Severe pain under right breast breast or in center of upper chest No  Severe nausea/vomiting No  Taking medicines as instructed yes  OBJECTIVE:  BP (!) 122/91 (BP Location: Left Arm, Patient Position: Sitting, Cuff Size: Normal)   Pulse 91   Breastfeeding Yes   Appearance alert, well appearing, and in no distress and oriented to person, place, and time.  ASSESSMENT: Postpartum  blood pressure check  PLAN: Discussed with Joellyn Haff, CNM, [facility]   Recommendations:  Call pt back after she has taken Nifedipine and recheck BP    Follow-up:  Recheck BP today @ 1330    Teiana Hajduk A Miata Culbreth  10/03/2021 12:15 PM     NURSE VISIT- BLOOD PRESSURE CHECK  SUBJECTIVE:  Anna Guzman is a 30 y.o. G20P0101 female here for BP check. She is postpartum, delivery date 09/26/21     OBJECTIVE:  BP 131/81 (BP Location: Right Arm, Patient Position: Sitting, Cuff Size: Normal)   Pulse (!) 120   Breastfeeding Yes    PLAN: Discussed with Joellyn Haff, CNM, [facility]   Recommendations: stop medicine 2 days before next visit   Follow-up: as scheduled   Deshawnda Acrey A Addilynne Olheiser  10/03/2021 2:06 PM   Chart reviewed for nurse visit. Agree with plan of care.  Cheral Marker, [place] 10/03/2021 2:08 PM

## 2021-10-08 ENCOUNTER — Telehealth (HOSPITAL_COMMUNITY): Payer: Self-pay | Admitting: *Deleted

## 2021-10-08 NOTE — Telephone Encounter (Signed)
Attempted Hospital Discharge Follow-Up Call.  Pt was at MD office for baby's circumcision procedure.  Requested we call her back later this evening.  Will call back in a few hours.

## 2021-10-08 NOTE — Telephone Encounter (Signed)
Attempted Hospital Discharge Follow-Up Call.  Left voice mail requesting that patient return RN's phone call.  

## 2021-10-19 ENCOUNTER — Inpatient Hospital Stay (HOSPITAL_COMMUNITY): Admit: 2021-10-19 | Payer: Self-pay

## 2021-10-30 ENCOUNTER — Other Ambulatory Visit: Payer: Self-pay | Admitting: Advanced Practice Midwife

## 2021-10-30 ENCOUNTER — Encounter: Payer: Self-pay | Admitting: Advanced Practice Midwife

## 2021-10-30 ENCOUNTER — Other Ambulatory Visit: Payer: Self-pay

## 2021-10-30 ENCOUNTER — Ambulatory Visit (INDEPENDENT_AMBULATORY_CARE_PROVIDER_SITE_OTHER): Payer: BC Managed Care – PPO | Admitting: Advanced Practice Midwife

## 2021-10-30 DIAGNOSIS — O24410 Gestational diabetes mellitus in pregnancy, diet controlled: Secondary | ICD-10-CM

## 2021-10-30 DIAGNOSIS — O1413 Severe pre-eclampsia, third trimester: Secondary | ICD-10-CM

## 2021-10-30 DIAGNOSIS — E039 Hypothyroidism, unspecified: Secondary | ICD-10-CM

## 2021-10-30 NOTE — Progress Notes (Signed)
4 weeks 6 days postpartum today. Needs postpartum assessment of thyroid function and glucose management. Patient would like to reduce number of needle sticks and time in office. Will bring back in one month for lab only visit + RN BP check.  Clayton Bibles, MSA, MSN, CNM Certified Nurse Midwife, Biochemist, clinical for [employer], [facility]

## 2021-10-30 NOTE — Progress Notes (Signed)
Post Partum Visit Note  Anna Guzman is a 30 y.o. G63P0101 female who presents for a postpartum visit. She is 4 weeks 6 days postpartum following a normal spontaneous vaginal delivery.  I have fully reviewed the prenatal and intrapartum course. The delivery was at 36+5 gestational weeks.  Anesthesia: epidural. Postpartum course has been uncomplicated. Baby is doing well. Baby is feeding by breast. Bleeding no bleeding. Bowel function is normal. Bladder function is normal. Patient is not sexually active. Contraception method is none. Postpartum depression screening: negative.   Upstream - 10/30/21 1346       Pregnancy Intention Screening   Does the patient want to become pregnant in the next year? Yes    Does the patient's partner want to become pregnant in the next year? Yes    Would the patient like to discuss contraceptive options today? No      Contraception Wrap Up   Current Method Abstinence    End Method Abstinence    Contraception Counseling Provided Yes            The pregnancy intention screening data noted above was reviewed. Potential methods of contraception were discussed. The patient elected to proceed with Abstinence.   Edinburgh Postnatal Depression Scale - 10/30/21 1340       Edinburgh Postnatal Depression Scale:  In the Past 7 Days   I have been able to laugh and see the funny side of things. 0    I have looked forward with enjoyment to things. 0    I have blamed myself unnecessarily when things went wrong. 0    I have been anxious or worried for no good reason. 0    I have felt scared or panicky for no good reason. 0    Things have been getting on top of me. 0    I have been so unhappy that I have had difficulty sleeping. 0    I have felt sad or miserable. 0    I have been so unhappy that I have been crying. 0    The thought of harming myself has occurred to me. 0    Edinburgh Postnatal Depression Scale Total 0             Health Maintenance Due   Topic Date Due   Pneumococcal Vaccine 82-28 Years old (1 - PCV) Never done   INFLUENZA VACCINE  Never done    The following portions of the patient's history were reviewed and updated as appropriate: allergies, current medications, past family history, past medical history, past social history, past surgical history, and problem list.  Review of Systems A comprehensive review of systems was negative.  Objective:  BP 105/72 (BP Location: Right Arm, Patient Position: Sitting, Cuff Size: Normal)   Pulse 96   Ht 5\' 5"  (1.651 m)   Wt 131 lb (59.4 kg)   Breastfeeding Yes   BMI 21.80 kg/m    General:  alert, cooperative, appears stated age, and no distress   Breasts:  not indicated  Lungs: Unlabored breathing  Heart:  regular rate   Abdomen: Soft, non-tender    GU exam:  not indicated       Assessment:    1. Postpartum care and examination of lactating mother - routine care, cleared to reintroduce sexual intercourse and gentle exercise - Discussed impact of breastfeeding on GU, arousal, etc.   2. Preeclampsia, severe, third trimester - Normotensive, asymptomatic, not on medication  3. Diet controlled gestational diabetes  mellitus (GDM) in third trimester - Needs postpartum glucose assessment  4. Hypothyroidism, unspecified type - Taking Synthroid regimen prescribed during pregnancy - Needs TSH and Free T4   Normal postpartum exam.   Plan:   Essential components of care per ACOG recommendations:  1.  Mood and well being: Patient with negative depression screening today. Reviewed local resources for support.  - Patient tobacco use? No.   - hx of drug use? No.    2. Infant care and feeding:  -Patient currently breastmilk feeding?   -Social determinants of health (SDOH) reviewed in EPIC. No concerns  3. Sexuality, contraception and birth spacing - Patient  with history of infertility. Does not desire contraception and is not opposed to concept of short interval  pregnancy.     4. Sleep and fatigue -Encouraged family/partner/community support of 4 hrs of uninterrupted sleep to help with mood and fatigue  5. Physical Recovery  - Discussed patients delivery and complications. She describes her labor as good. - Patient had a Vaginal, no problems at delivery. Patient had a right perineal abrasion, repair not indicated. Perineal healing reviewed. Patient expressed understanding - Patient has urinary incontinence? No. - Patient is safe to resume physical and sexual activity  6.  Health Maintenance - HM due items addressed Yes - Last pap smear  Diagnosis  Date Value Ref Range Status  03/25/2021   Final   - Negative for intraepithelial lesion or malignancy (NILM)   Pap smear not done at today's visit.  -Breast Cancer screening indicated? No.   7. Chronic Disease/Pregnancy Condition follow up: None  - PCP follow up  Calvert Cantor, CNM Center for [employer], [facility]

## 2021-11-21 ENCOUNTER — Other Ambulatory Visit: Payer: Self-pay | Admitting: Family Medicine

## 2021-11-27 ENCOUNTER — Encounter: Payer: Self-pay | Admitting: Women's Health

## 2021-11-27 ENCOUNTER — Telehealth: Payer: Self-pay

## 2021-11-27 ENCOUNTER — Ambulatory Visit: Payer: BC Managed Care – PPO | Admitting: Obstetrics & Gynecology

## 2021-11-27 ENCOUNTER — Other Ambulatory Visit: Payer: Self-pay

## 2021-11-27 ENCOUNTER — Telehealth: Payer: BC Managed Care – PPO

## 2021-11-27 ENCOUNTER — Other Ambulatory Visit: Payer: BC Managed Care – PPO

## 2021-11-27 VITALS — BP 119/78 | HR 117 | Ht 65.0 in

## 2021-11-27 DIAGNOSIS — N61 Mastitis without abscess: Secondary | ICD-10-CM

## 2021-11-27 DIAGNOSIS — Z0130 Encounter for examination of blood pressure without abnormal findings: Secondary | ICD-10-CM | POA: Diagnosis not present

## 2021-11-27 DIAGNOSIS — O9279 Other disorders of lactation: Secondary | ICD-10-CM | POA: Diagnosis not present

## 2021-11-27 MED ORDER — ERYTHROMYCIN BASE 500 MG PO TABS: 500.0000 mg | ORAL_TABLET | Freq: Two times a day (BID) | ORAL | 0 refills | Status: AC

## 2021-11-27 NOTE — Progress Notes (Addendum)
° °  I connected with  Pricilla Handler on 11/27/21 by a video enabled telemedicine application and verified that I am speaking with the correct person using two identifiers.   I discussed the limitations of evaluation and management by telemedicine. The patient expressed understanding and agreed to proceed. Patient: Home Provider: Office  NURSE VISIT- BLOOD PRESSURE CHECK  SUBJECTIVE:  Anna Guzman is a 30 y.o. G11P0101 female here for BP check. She is postpartum, delivery date 09/26/21     HYPERTENSION ROS:  Postpartum:  Severe headaches that don't go away with tylenol/other medicines: No  Visual changes (seeing spots/double/blurred vision) No  Severe pain under right breast breast or in center of upper chest No  Severe nausea/vomiting No  Taking medicines as instructed not applicable  OBJECTIVE:  BP 119/78 (BP Location: Right Arm, Patient Position: Sitting, Cuff Size: Normal)    Pulse (!) 117    Ht 5\' 5"  (1.651 m)    Breastfeeding Yes    BMI 21.80 kg/m   Appearance alert, well appearing, and in no distress and oriented to person, place, and time.  ASSESSMENT: Postpartum  blood pressure check  PLAN: Discussed with Dr.   Recommendations: no changes needed   Follow-up:  PRN    Alondra Sahni A Averiana Clouatre  11/27/2021 9:17 AM   Chart reviewed for nurse visit. Agree with plan of care.  11/29/2021, DO 11/30/2021 11:54 AM

## 2021-11-27 NOTE — Progress Notes (Signed)
° °  GYN VISIT Patient name: Anna Guzman MRN [id number]  Date of birth: 11-06-1991 Chief Complaint:   Mastitis  History of Present Illness:   Anna Guzman is a 30 y.o. G45P0101  female being seen today for the following concerns  Breast issues: Started last night initially noted engorgement.  She reports swelling, pain and redness.  She has been able to pump on that side- she was afraid to nurse the baby from that side.  She also notes fever, chills and body aches.  Temp of 101.3 at home.  She wasn't sure if the systemic symptoms were from her breast or something else.  Denies any upper respiratory concerns- no cough, sore throat or other symptoms.     Depression screen [facility] 2/9 08/01/2021 03/25/2021 10/06/2018 12/28/2017 10/01/2016  Decreased Interest 0 0 0 0 0  Down, Depressed, Hopeless 0 0 0 0 0  PHQ - 2 Score 0 0 0 0 0  Altered sleeping 0 0 0 - -  Tired, decreased energy 1 1 0 - -  Change in appetite 0 0 0 - -  Feeling bad or failure about yourself  0 0 0 - -  Trouble concentrating 0 0 0 - -  Moving slowly or fidgety/restless 0 0 0 - -  Suicidal thoughts 0 0 0 - -  PHQ-9 Score 1 1 0 - -     Review of Systems:   Pertinent items are noted in HPI  Pertinent History Reviewed:  Reviewed past medical,surgical, social, obstetrical and family history.  Reviewed problem list, medications and allergies. Physical Assessment:  HR: 118  BP: 115/77      Physical Examination:   General appearance: alert, well appearing, and in no distress  Psych: mood appropriate, normal affect  Skin: warm & dry   Breast: Normal right breast, Left breast with 5x5 areas of engorgement with tenderness to palpation, some warmth, no erythema noted,  Milk expression noted on exam  Respiratory: normal respiratory effort, no distress  Extremities: no edema   Chaperone:  declined     Assessment & Plan:  1) Breast engorgement vs early mastitis -encouraged warm compressed -discussed importance of continued  feeding/breast pumping -Due to PCN allergy- erythromycin sent in If no further temp, engorgement resolves with feeding- may hold off on antibiotics; however, if symptoms persist would complete full antibiotic course -F/U if no improvement or worsening of symptoms   Myna Hidalgo, DO Attending Obstetrician & Gynecologist, Faculty Practice Center for [employer], [facility]

## 2021-11-27 NOTE — Telephone Encounter (Signed)
After viewing Mychart msg, spoke with Dr Charlotta Newton who recommended the pt be seen. Called pt to see if she could come in. Pt stated she can be here in approx 45 min. Pt placed on schedule.

## 2021-12-04 ENCOUNTER — Other Ambulatory Visit: Payer: BC Managed Care – PPO

## 2021-12-06 ENCOUNTER — Other Ambulatory Visit: Payer: Self-pay

## 2021-12-06 ENCOUNTER — Other Ambulatory Visit: Payer: BC Managed Care – PPO

## 2021-12-06 DIAGNOSIS — Z131 Encounter for screening for diabetes mellitus: Secondary | ICD-10-CM

## 2021-12-06 DIAGNOSIS — Z8632 Personal history of gestational diabetes: Secondary | ICD-10-CM

## 2021-12-06 DIAGNOSIS — E039 Hypothyroidism, unspecified: Secondary | ICD-10-CM

## 2021-12-07 LAB — HEMOGLOBIN A1C
Est. average glucose Bld gHb Est-mCnc: 120 mg/dL
Hgb A1c MFr Bld: 5.8 % — ABNORMAL HIGH (ref 4.8–5.6)

## 2021-12-07 LAB — TSH+FREE T4
Free T4: 1.21 ng/dL (ref 0.82–1.77)
TSH: 2.06 u[IU]/mL (ref 0.450–4.500)

## 2021-12-07 LAB — GLUCOSE TOLERANCE, 1 HOUR: Glucose, 1Hr PP: 177 mg/dL (ref 70–199)

## 2021-12-13 ENCOUNTER — Encounter: Payer: Self-pay | Admitting: *Deleted

## 2022-05-26 ENCOUNTER — Encounter: Payer: Self-pay | Admitting: Obstetrics & Gynecology

## 2022-05-26 ENCOUNTER — Other Ambulatory Visit: Payer: Self-pay | Admitting: *Deleted

## 2022-05-26 MED ORDER — TRINATE PO TABS: ORAL_TABLET | ORAL | 12 refills | Status: DC

## 2022-12-23 IMAGING — CT CT ANGIO CHEST
3 of 7 series · 19 of 36 positions shown · IV contrast (omnipaque)
Comparison: None.

CLINICAL DATA: 30-year-old postpartum female with chest pain.

EXAM:
CT ANGIOGRAPHY CHEST WITH CONTRAST
TECHNIQUE: Multidetector CT imaging of the chest was performed using the
standard protocol during bolus administration of intravenous
contrast. Multiplanar CT image reconstructions and MIPs were
obtained to evaluate the vascular anatomy.
CONTRAST:  100 mL Omnipaque 350, intravenous

[Series 6: pe lung · axial · 0.61mm/px · z∈[-634,-482]mm · 4 of 128 slices shown]
[im 26/128  mediastinal]
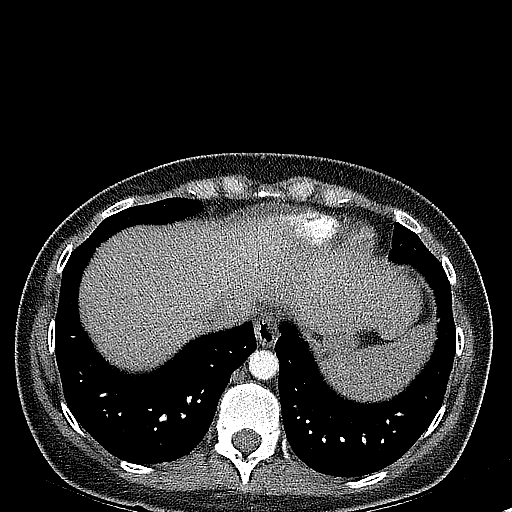
[im 51/128  mediastinal]
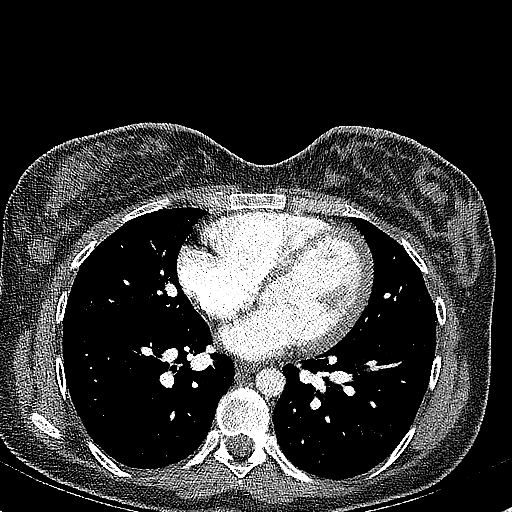
[im 77/128  mediastinal]
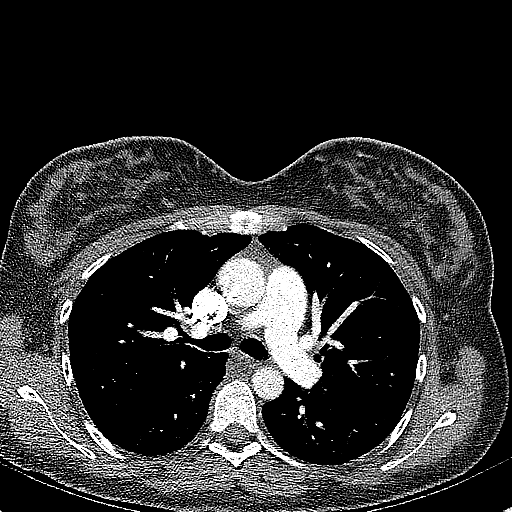
[im 102/128  mediastinal]
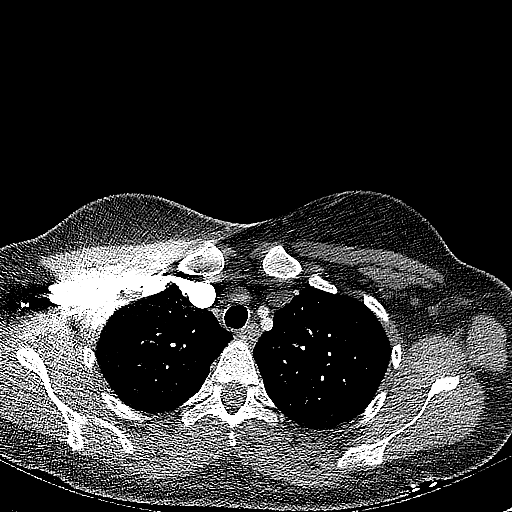

[Series 7: pe thins · axial · 0.61mm/px · z∈[-668,-448]mm · 14 of 365 slices shown]
[im 25/365  lung]
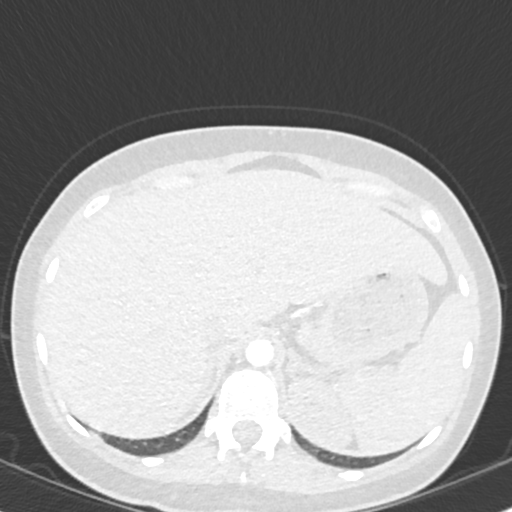
[im 49/365  mediastinal]
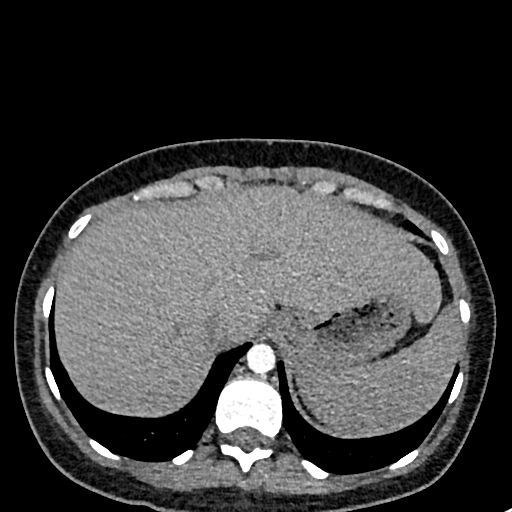
[im 73/365  lung]
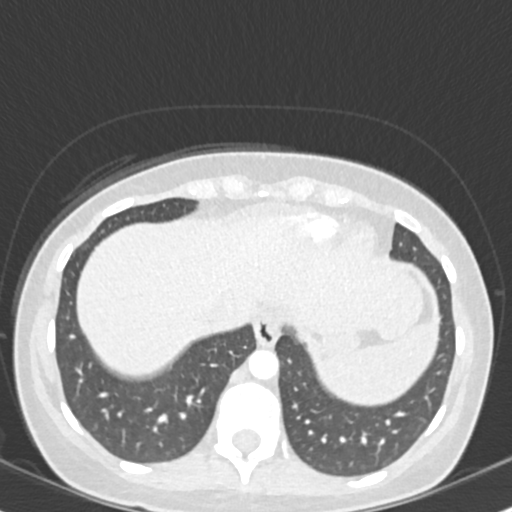
[im 98/365  mediastinal]
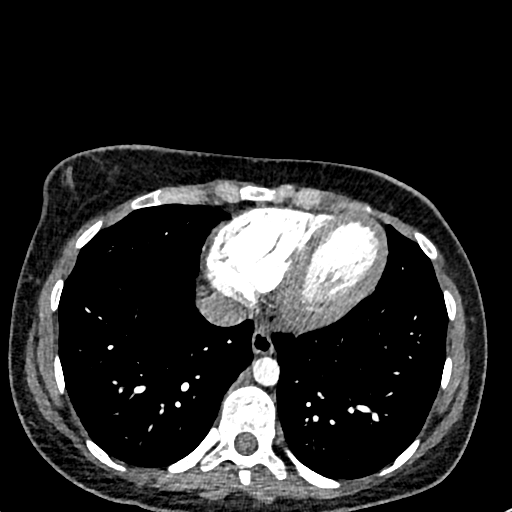
[im 122/365  lung]
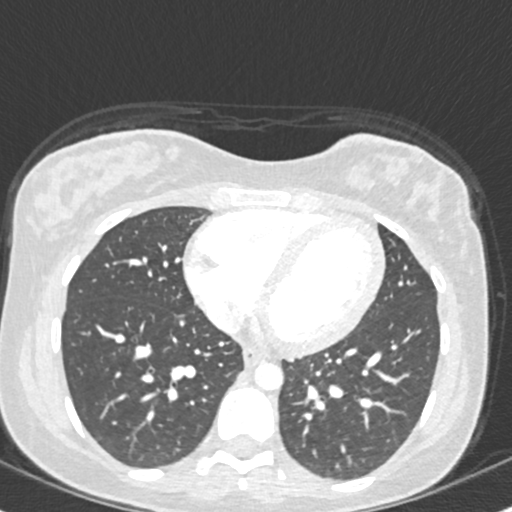
[im 146/365  mediastinal]
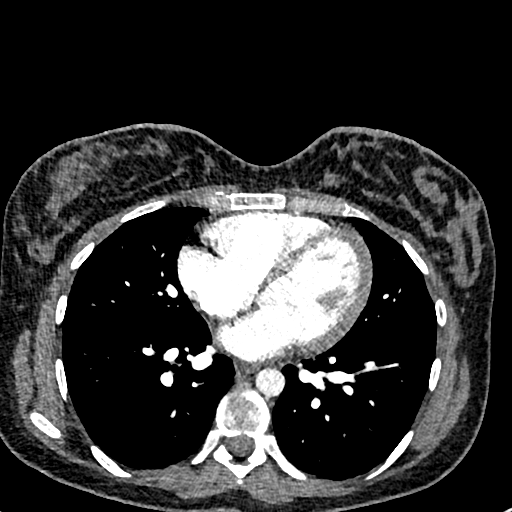
[im 170/365  lung]
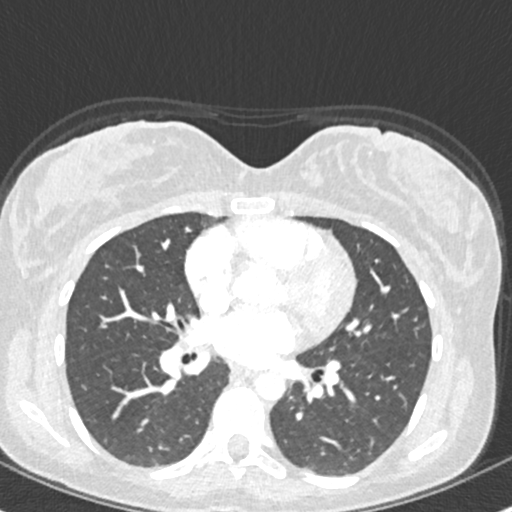
[im 195/365  mediastinal]
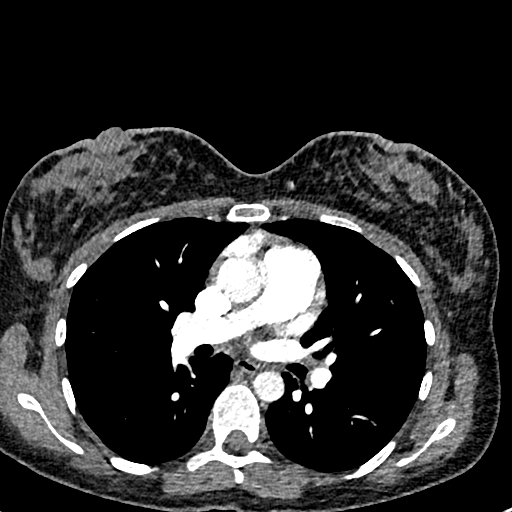
[im 219/365  lung]
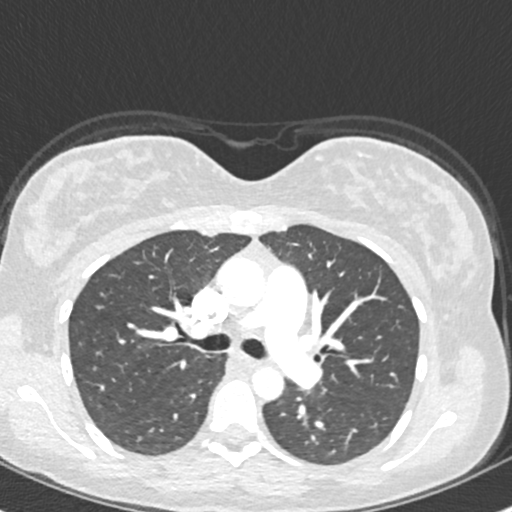
[im 243/365  mediastinal]
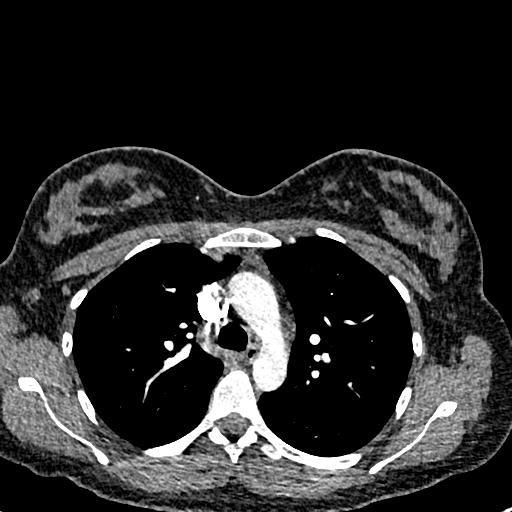
[im 267/365  lung]
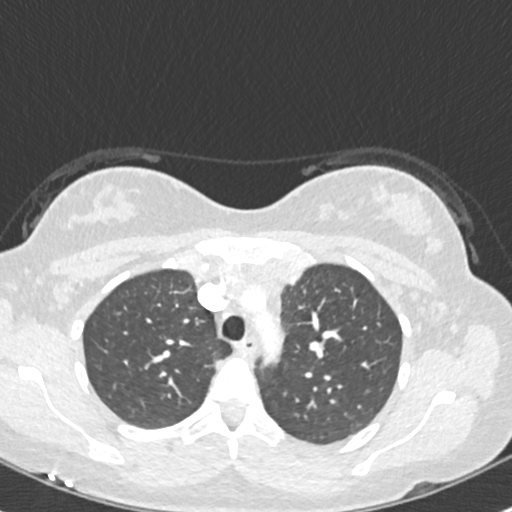
[im 292/365  mediastinal]
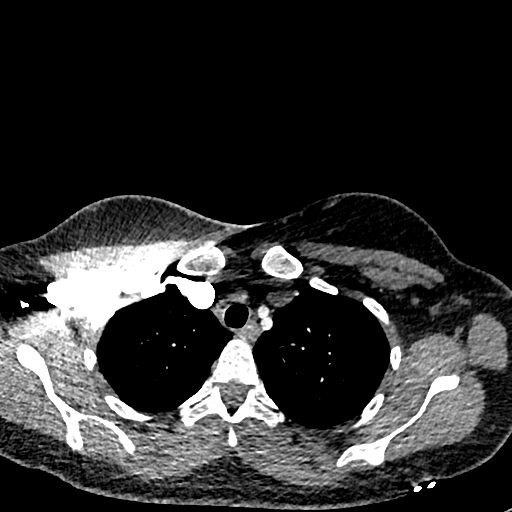
[im 316/365  lung]
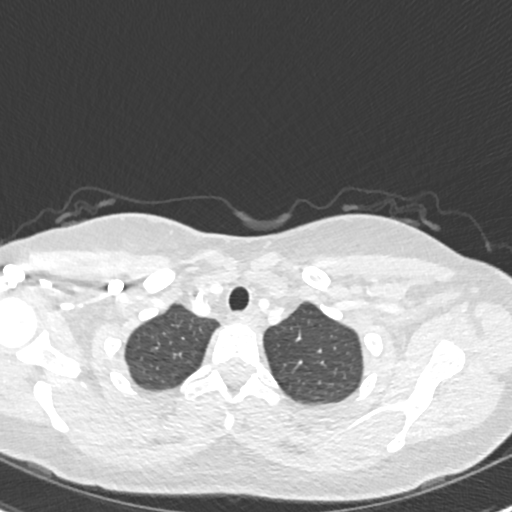
[im 340/365  mediastinal]
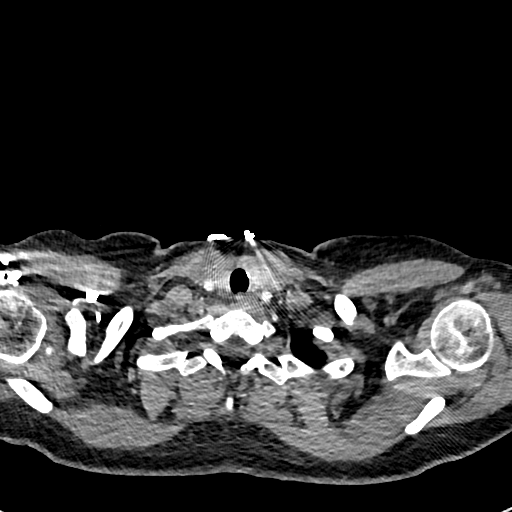

[Series 8: pe 2mm cor · coronal · 0.53mm/px · 1 of 106 slices shown]
[im 53/106  mediastinal]
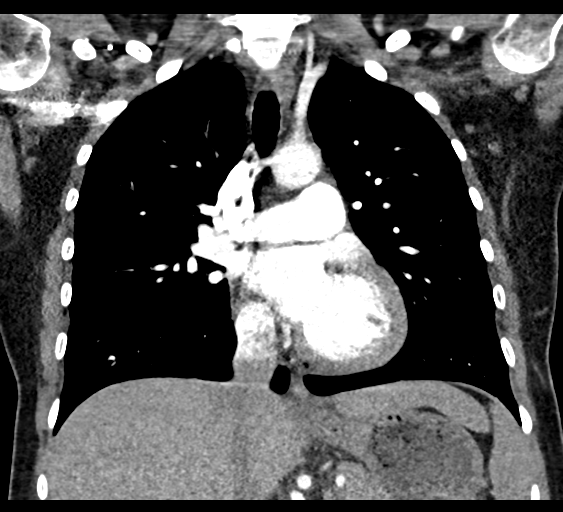

[19 of 36 positions shown; findings below may reference images not displayed]

FINDINGS: Cardiovascular: Satisfactory opacification of the pulmonary arteries
to the segmental level. No evidence of pulmonary embolism. Normal
heart size. No pericardial effusion.

Mediastinum/Nodes: No enlarged mediastinal, hilar, or axillary lymph
nodes. Thyroid gland, trachea, and esophagus demonstrate no
significant findings.

Lungs/Pleura: No focal consolidations. No suspicious pulmonary
nodules. No pleural effusion or pneumothorax.

Upper Abdomen: The visualized upper abdomen is within normal limits.

Musculoskeletal: No chest wall abnormality. No acute or significant
osseous findings.

Review of the MIP images confirms the above findings.
IMPRESSION: Vascular:

No evidence of pulmonary embolism.

Non-Vascular:

No acute intrathoracic abnormality.

## 2023-11-30 ENCOUNTER — Encounter: Payer: Self-pay | Admitting: Advanced Practice Midwife

## 2023-11-30 ENCOUNTER — Other Ambulatory Visit: Payer: BC Managed Care – PPO

## 2023-12-04 ENCOUNTER — Encounter: Payer: Self-pay | Admitting: Advanced Practice Midwife

## 2023-12-10 ENCOUNTER — Other Ambulatory Visit: Payer: Self-pay

## 2023-12-10 DIAGNOSIS — O209 Hemorrhage in early pregnancy, unspecified: Secondary | ICD-10-CM | POA: Diagnosis not present

## 2023-12-10 NOTE — Progress Notes (Unsigned)
 Patient called with c/o heavy vaginal bleeding and passing clots as if she is on her cycle.  She is unsure how far along she may be but is "already showing". Will get HCG today and may need repeat tomorrow.

## 2023-12-11 ENCOUNTER — Other Ambulatory Visit: Payer: Self-pay | Admitting: Adult Health

## 2023-12-11 DIAGNOSIS — Z3201 Encounter for pregnancy test, result positive: Secondary | ICD-10-CM

## 2023-12-11 LAB — BETA HCG QUANT (REF LAB): hCG Quant: 76 m[IU]/mL

## 2023-12-14 DIAGNOSIS — Z3201 Encounter for pregnancy test, result positive: Secondary | ICD-10-CM | POA: Diagnosis not present

## 2023-12-15 ENCOUNTER — Other Ambulatory Visit: Payer: Self-pay | Admitting: Adult Health

## 2023-12-15 DIAGNOSIS — O039 Complete or unspecified spontaneous abortion without complication: Secondary | ICD-10-CM

## 2023-12-15 LAB — BETA HCG QUANT (REF LAB): hCG Quant: 11 m[IU]/mL

## 2023-12-17 ENCOUNTER — Other Ambulatory Visit: Payer: 59

## 2023-12-29 ENCOUNTER — Encounter: Payer: Self-pay | Admitting: Obstetrics & Gynecology

## 2023-12-29 ENCOUNTER — Other Ambulatory Visit (HOSPITAL_COMMUNITY)
Admission: RE | Admit: 2023-12-29 | Discharge: 2023-12-29 | Disposition: A | Discharge status: Home / Self Care | Payer: 59 | Source: Ambulatory Visit | Attending: Obstetrics & Gynecology | Admitting: Obstetrics & Gynecology

## 2023-12-29 ENCOUNTER — Ambulatory Visit (INDEPENDENT_AMBULATORY_CARE_PROVIDER_SITE_OTHER): Payer: 59 | Admitting: Obstetrics & Gynecology

## 2023-12-29 VITALS — BP 139/88 | HR 95 | Ht 65.0 in | Wt 127.0 lb

## 2023-12-29 DIAGNOSIS — Z01419 Encounter for gynecological examination (general) (routine) without abnormal findings: Secondary | ICD-10-CM | POA: Insufficient documentation

## 2023-12-29 DIAGNOSIS — E039 Hypothyroidism, unspecified: Secondary | ICD-10-CM

## 2023-12-29 DIAGNOSIS — Z1151 Encounter for screening for human papillomavirus (HPV): Secondary | ICD-10-CM | POA: Diagnosis not present

## 2023-12-29 MED ORDER — MEDROXYPROGESTERONE ACETATE 10 MG PO TABS: ORAL_TABLET | ORAL | 11 refills | Status: DC

## 2023-12-29 NOTE — Progress Notes (Signed)
Subjective:     Anna Guzman is a 33 y.o. female here for a routine exam.  No LMP recorded. [id number] Birth Control Method:  none Menstrual Calendar(currently): irregular, mostly anovulatory amenorrhea  Current complaints: recent early SAB.   Current acute medical issues:  none   Recent Gynecologic History No LMP recorded. Last Pap: 03/2021,  normal Last mammogram: na,    Past Medical History:  Diagnosis Date   Asthma    MTHFR gene mutation    Restless leg syndrome     Past Surgical History:  Procedure Laterality Date   APPENDECTOMY     WISDOM TOOTH EXTRACTION      OB History     Gravida  2   Para  1   Term  0   Preterm  1   AB  1   Living  1      SAB  1   IAB  0   Ectopic  0   Multiple  0   Live Births  1           Social History   Socioeconomic History   Marital status: Married    Spouse name: Not on file   Number of children: Not on file   Years of education: Not on file   Highest education level: Not on file  Occupational History   Not on file  Tobacco Use   Smoking status: Never   Smokeless tobacco: Never  Vaping Use   Vaping status: Never Used  Substance and Sexual Activity   Alcohol use: Not Currently    Comment: very rarely   Drug use: No   Sexual activity: Yes    Birth control/protection: None  Other Topics Concern   Not on file  Social History Narrative   Not on file   Social Drivers of Health   Financial Resource Strain: Low Risk  (10/30/2021)   Overall Financial Resource Strain (CARDIA)    Difficulty of Paying Living Expenses: Not hard at all  Food Insecurity: No Food Insecurity (10/30/2021)   Hunger Vital Sign    Worried About Running Out of Food in the Last Year: Never true    Ran Out of Food in the Last Year: Never true  Transportation Needs: No Transportation Needs (10/30/2021)   PRAPARE - Administrator, Civil Service (Medical): No    Lack of Transportation (Non-Medical): No  Physical Activity:  Inactive (10/30/2021)   Exercise Vital Sign    Days of Exercise per Week: 0 days    Minutes of Exercise per Session: 0 min  Stress: No Stress Concern Present (10/30/2021)   [employer] of Occupational Health - Occupational Stress Questionnaire    Feeling of Stress : Not at all  Social Connections: Socially Integrated (10/30/2021)   Social Connection and Isolation Panel [NHANES]    Frequency of Communication with Friends and Family: Once a week    Frequency of Social Gatherings with Friends and Family: Twice a week    Attends Religious Services: More than 4 times per year    Active Member of [employer] or Organizations: Yes    Attends Engineer, structural: More than 4 times per year    Marital Status: Married    Family History  Problem Relation Age of Onset   Osteoporosis Mother    Hyperlipidemia Father    Other Brother        has hearing aids   Alzheimer's disease Paternal Grandfather  Current Outpatient Medications:    fluticasone (FLOVENT HFA) 110 MCG/ACT inhaler, Inhale into the lungs 2 (two) times daily., Disp: , Rfl:    levothyroxine (SYNTHROID) 50 MCG tablet, TAKE 1 TABLET BY MOUTH EVERY DAY BEFORE BREAKFAST (Patient taking differently: Sunday, Monday, Wed,nesday, Thurs,day Friday Tuesday, Saturday ), Disp: 90 tablet, Rfl: 2   medroxyPROGESTERone (PROVERA) 10 MG tablet, 1 by mouth daily as directed, Disp: 10 tablet, Rfl: 11   Prenatal Vit-Fe Fumarate-FA (TRINATE) TABS, Take 1 daily, Disp: 30 tablet, Rfl: 12   PULMICORT FLEXHALER 180 MCG/ACT inhaler, Inhale 2 puffs into the lungs 2 (two) times daily., Disp: , Rfl:   Review of Systems  Review of Systems  Constitutional: Negative for fever, chills, weight loss, malaise/fatigue and diaphoresis.  HENT: Negative for hearing loss, ear pain, nosebleeds, congestion, sore throat, neck pain, tinnitus and ear discharge.   Eyes: Negative for blurred vision, double vision, photophobia, pain, discharge and  redness.  Respiratory: Negative for cough, hemoptysis, sputum production, shortness of breath, wheezing and stridor.   Cardiovascular: Negative for chest pain, palpitations, orthopnea, claudication, leg swelling and PND.  Gastrointestinal: negative for abdominal pain. Negative for heartburn, nausea, vomiting, diarrhea, constipation, blood in stool and melena.  Genitourinary: Negative for dysuria, urgency, frequency, hematuria and flank pain.  Musculoskeletal: Negative for myalgias, back pain, joint pain and falls.  Skin: Negative for itching and rash.  Neurological: Negative for dizziness, tingling, tremors, sensory change, speech change, focal weakness, seizures, loss of consciousness, weakness and headaches.  Endo/Heme/Allergies: Negative for environmental allergies and polydipsia. Does not bruise/bleed easily.  Psychiatric/Behavioral: Negative for depression, suicidal ideas, hallucinations, memory loss and substance abuse. The patient is not nervous/anxious and does not have insomnia.        Objective:  Blood pressure 139/88, pulse 95, height 5\' 5"  (1.651 m), weight 127 lb (57.6 kg), not currently breastfeeding.   Physical Exam  Vitals reviewed. Constitutional: She is oriented to person, place, and time. She appears well-developed and well-nourished.  HENT:  Head: Normocephalic and atraumatic.        Right Ear: External ear normal.  Left Ear: External ear normal.  Nose: Nose normal.  Mouth/Throat: Oropharynx is clear and moist.  Eyes: Conjunctivae and EOM are normal. Pupils are equal, round, and reactive to light. Right eye exhibits no discharge. Left eye exhibits no discharge. No scleral icterus.  Neck: Normal range of motion. Neck supple. No tracheal deviation present. No thyromegaly present.  Cardiovascular: Normal rate, regular rhythm, normal heart sounds and intact distal pulses.  Exam reveals no gallop and no friction rub.   No murmur heard. Respiratory: Effort normal and breath  sounds normal. No respiratory distress. She has no wheezes. She has no rales. She exhibits no tenderness.  GI: Soft. Bowel sounds are normal. She exhibits no distension and no mass. There is no tenderness. There is no rebound and no guarding.  Genitourinary:  Breasts no masses skin changes or nipple changes bilaterally      Vulva is normal without lesions Vagina is pink moist without discharge Cervix normal in appearance and pap is done Uterus is normal size shape and contour Adnexa is negative with normal sized ovaries   Musculoskeletal: Normal range of motion. She exhibits no edema and no tenderness.  Neurological: She is alert and oriented to person, place, and time. She has normal reflexes. She displays normal reflexes. No cranial nerve deficit. She exhibits normal muscle tone. Coordination normal.  Skin: Skin is warm and dry. No rash noted.  No erythema. No pallor.  Psychiatric: She has a normal mood and affect. Her behavior is normal. Judgment and thought content normal.       Medications Ordered at today's visit: Meds ordered this encounter  Medications   medroxyPROGESTERone (PROVERA) 10 MG tablet    Sig: 1 by mouth daily as directed    Dispense:  10 tablet    Refill:  11    Other orders placed at today's visit: Orders Placed This Encounter  Procedures   Thyroid Panel With TSH     ASSESSMENT + PLAN:    ICD-10-CM   1. Well woman exam with routine gynecological exam  Z01.419     2. Encounter for gynecological examination with Papanicolaou smear of cervix  Z01.419 Cytology - PAP( )    3. Hypothyroidism on synthroid 50 mcg  E03.9 Thyroid Panel With TSH          No follow-ups on file.

## 2023-12-30 ENCOUNTER — Encounter: Payer: Self-pay | Admitting: Obstetrics & Gynecology

## 2023-12-30 LAB — THYROID PANEL WITH TSH
Free Thyroxine Index: 3.1 (ref 1.2–4.9)
T3 Uptake Ratio: 26 % (ref 24–39)
T4, Total: 11.9 ug/dL (ref 4.5–12.0)
TSH: 0.673 u[IU]/mL (ref 0.450–4.500)

## 2023-12-31 MED ORDER — LEVOTHYROXINE SODIUM 50 MCG PO TABS: ORAL_TABLET | ORAL | 3 refills | Status: DC

## 2024-01-04 ENCOUNTER — Encounter: Payer: Self-pay | Admitting: Obstetrics & Gynecology

## 2024-01-04 LAB — CYTOLOGY - PAP
Chlamydia: NEGATIVE
Comment: NEGATIVE
Comment: NEGATIVE
Comment: NORMAL
Diagnosis: UNDETERMINED — AB
High risk HPV: NEGATIVE
Neisseria Gonorrhea: NEGATIVE

## 2024-06-13 ENCOUNTER — Encounter: Payer: Self-pay | Admitting: Obstetrics & Gynecology

## 2024-06-13 ENCOUNTER — Encounter: Payer: Self-pay | Admitting: *Deleted

## 2024-06-13 ENCOUNTER — Ambulatory Visit (INDEPENDENT_AMBULATORY_CARE_PROVIDER_SITE_OTHER): Admitting: *Deleted

## 2024-06-13 VITALS — BP 124/85 | HR 120 | Ht 65.0 in | Wt 119.0 lb

## 2024-06-13 DIAGNOSIS — O09299 Supervision of pregnancy with other poor reproductive or obstetric history, unspecified trimester: Secondary | ICD-10-CM | POA: Diagnosis not present

## 2024-06-13 DIAGNOSIS — Z3201 Encounter for pregnancy test, result positive: Secondary | ICD-10-CM

## 2024-06-13 LAB — POCT URINE PREGNANCY: Preg Test, Ur: POSITIVE — AB

## 2024-06-13 NOTE — Progress Notes (Signed)
   NURSE VISIT- PREGNANCY CONFIRMATION   SUBJECTIVE:  Anna Guzman is a 33 y.o. [phone] female at [redacted]w[redacted]d by certain LMP of Patient's last menstrual period was 04/14/2024. Here for pregnancy confirmation.  Home pregnancy test: positive x 1.  She reports cramping & nausea.  She is taking prenatal vitamins. Pt had a miscarriage in Jan 2025 and requested quant and progesterone .   OBJECTIVE:  BP 124/85 (BP Location: Left Arm, Patient Position: Sitting, Cuff Size: Normal)   Pulse (!) 120   Ht 5' 5 (1.651 m)   Wt 119 lb (54 kg)   LMP 04/14/2024   BMI 19.80 kg/m   Appears well, in no apparent distress  Results for orders placed or performed in visit on 06/13/24 (from the past 24 hours)  POCT urine pregnancy   Collection Time: 06/13/24 10:25 AM  Result Value Ref Range   Preg Test, Ur Positive (A) Negative    ASSESSMENT: Positive pregnancy test, [redacted]w[redacted]d by LMP    PLAN: Schedule for dating ultrasound pending quant & progesterone  Prenatal vitamins: continue   Nausea medicines: not currently needed   OB packet given: Yes  Clarita Salt  06/13/2024 10:33 AM

## 2024-06-14 ENCOUNTER — Other Ambulatory Visit: Payer: Self-pay | Admitting: Women's Health

## 2024-06-14 ENCOUNTER — Ambulatory Visit: Payer: Self-pay | Admitting: Women's Health

## 2024-06-14 DIAGNOSIS — O3680X0 Pregnancy with inconclusive fetal viability, not applicable or unspecified: Secondary | ICD-10-CM

## 2024-06-14 LAB — BETA HCG QUANT (REF LAB): hCG Quant: 291 m[IU]/mL

## 2024-06-14 LAB — PROGESTERONE: Progesterone: 17.8 ng/mL

## 2024-06-15 DIAGNOSIS — O3680X0 Pregnancy with inconclusive fetal viability, not applicable or unspecified: Secondary | ICD-10-CM | POA: Diagnosis not present

## 2024-06-16 ENCOUNTER — Ambulatory Visit: Payer: Self-pay | Admitting: Women's Health

## 2024-06-16 LAB — BETA HCG QUANT (REF LAB): hCG Quant: 844 m[IU]/mL

## 2024-06-28 ENCOUNTER — Encounter: Payer: Self-pay | Admitting: Obstetrics & Gynecology

## 2024-06-28 ENCOUNTER — Ambulatory Visit: Admitting: Obstetrics & Gynecology

## 2024-06-28 VITALS — BP 138/78 | HR 83 | Ht 65.0 in | Wt 119.0 lb

## 2024-06-28 DIAGNOSIS — Z3490 Encounter for supervision of normal pregnancy, unspecified, unspecified trimester: Secondary | ICD-10-CM

## 2024-06-28 DIAGNOSIS — Z3A01 Less than 8 weeks gestation of pregnancy: Secondary | ICD-10-CM | POA: Diagnosis not present

## 2024-06-28 NOTE — Progress Notes (Signed)
 Follow up appointment: Early pregnancy  Chief Complaint  Anna Guzman presents with   US  to evaluate early pregnancy    Blood pressure 138/78, pulse 83, height 5' 5 (1.651 m), weight 119 lb (54 kg), last menstrual period 04/14/2024.  [id number]  [redacted]w[redacted]d +fetal cardiac activity, 70 bpm,  which is consistent with her HCG levels not her LMP EDD 02/19/2025   MEDS ordered this encounter: No orders of the defined types were placed in this encounter.   Orders for this encounter: No orders of the defined types were placed in this encounter.   Impression + Management Plan   ICD-10-CM   1. Early stage of pregnancy  Z34.90       Follow Up: Return in about 3 weeks (around 07/19/2024) for with Dr Jayne: i will do bedside sonogram again.     All questions were answered.  Past Medical History:  Diagnosis Date   Asthma    MTHFR gene mutation    Restless leg syndrome     Past Surgical History:  Procedure Laterality Date   APPENDECTOMY     WISDOM TOOTH EXTRACTION      OB History     Gravida  3   Para  1   Term  0   Preterm  1   AB  1   Living  1      SAB  1   IAB  0   Ectopic  0   Multiple  0   Live Births  1           Allergies  Allergen Reactions   Sulfa Antibiotics    Penicillins Hives and Rash    Social History   Socioeconomic History   Marital status: Married    Spouse name: Not on file   Number of children: Not on file   Years of education: Not on file   Highest education level: Not on file  Occupational History   Not on file  Tobacco Use   Smoking status: Never   Smokeless tobacco: Never  Vaping Use   Vaping status: Never Used  Substance and Sexual Activity   Alcohol use: Not Currently    Comment: very rarely   Drug use: No   Sexual activity: Yes    Birth control/protection: None  Other Topics Concern   Not on file  Social History Narrative   Not on file   Social Drivers of Health   Financial Resource Strain: Low Risk   (10/30/2021)   Overall Financial Resource Strain (CARDIA)    Difficulty of Paying Living Expenses: Not hard at all  Food Insecurity: No Food Insecurity (10/30/2021)   Hunger Vital Sign    Worried About Running Out of Food in the Last Year: Never true    Ran Out of Food in the Last Year: Never true  Transportation Needs: No Transportation Needs (10/30/2021)   PRAPARE - Administrator, Civil Service (Medical): No    Lack of Transportation (Non-Medical): No  Physical Activity: Inactive (10/30/2021)   Exercise Vital Sign    Days of Exercise per Week: 0 days    Minutes of Exercise per Session: 0 min  Stress: No Stress Concern Present (10/30/2021)   [employer] of Occupational Health - Occupational Stress Questionnaire    Feeling of Stress : Not at all  Social Connections: Socially Integrated (10/30/2021)   Social Connection and Isolation Panel    Frequency of Communication with Friends and Family: Once a week  Frequency of Social Gatherings with Friends and Family: Twice a week    Attends Religious Services: More than 4 times per year    Active Member of [employer] or Organizations: Yes    Attends Engineer, structural: More than 4 times per year    Marital Status: Married    Family History  Problem Relation Age of Onset   Alzheimer's disease Paternal Grandfather    Hyperlipidemia Father    Osteoporosis Mother    Other Brother        has hearing aids   Other Son        eye issue

## 2024-07-07 ENCOUNTER — Encounter: Payer: Self-pay | Admitting: Obstetrics & Gynecology

## 2024-07-07 ENCOUNTER — Ambulatory Visit: Admitting: Obstetrics and Gynecology

## 2024-07-07 ENCOUNTER — Other Ambulatory Visit

## 2024-07-07 ENCOUNTER — Other Ambulatory Visit: Payer: Self-pay | Admitting: Obstetrics and Gynecology

## 2024-07-07 ENCOUNTER — Other Ambulatory Visit (HOSPITAL_COMMUNITY)
Admission: RE | Admit: 2024-07-07 | Discharge: 2024-07-07 | Disposition: A | Discharge status: Home / Self Care | Source: Ambulatory Visit | Attending: Obstetrics and Gynecology | Admitting: Obstetrics and Gynecology

## 2024-07-07 VITALS — BP 118/83 | HR 118 | Wt 119.0 lb

## 2024-07-07 DIAGNOSIS — Z3A01 Less than 8 weeks gestation of pregnancy: Secondary | ICD-10-CM

## 2024-07-07 DIAGNOSIS — Z3490 Encounter for supervision of normal pregnancy, unspecified, unspecified trimester: Secondary | ICD-10-CM

## 2024-07-07 DIAGNOSIS — N939 Abnormal uterine and vaginal bleeding, unspecified: Secondary | ICD-10-CM

## 2024-07-07 DIAGNOSIS — N898 Other specified noninflammatory disorders of vagina: Secondary | ICD-10-CM

## 2024-07-07 DIAGNOSIS — Z3491 Encounter for supervision of normal pregnancy, unspecified, first trimester: Secondary | ICD-10-CM | POA: Diagnosis not present

## 2024-07-07 DIAGNOSIS — O3680X0 Pregnancy with inconclusive fetal viability, not applicable or unspecified: Secondary | ICD-10-CM

## 2024-07-07 NOTE — Progress Notes (Signed)
 US  7+4 wks,single IUP with yolk sac,CRL 13.34 mm,normal ovaries,subchorionic hemorrhage 4 x .4 x .9 cm

## 2024-07-07 NOTE — Patient Instructions (Signed)

## 2024-07-07 NOTE — Progress Notes (Signed)
   GYNECOLOGY PROGRESS NOTE  History:  33 y.o. G3P0111 presents to [facility]. Reports she had diarrhea last night then started cramping and bleeding last night. She also reports body ache and shivers. She experienced a clot coming out and has been bleeding more than spotting today. She has taken tylenol  this morning   She had bedside transvaginal u/s with Dr. Jayne 7/22 which showed 6w 2d cardiac activity 70 bpm  Was on progesterone    Health Maintenance Due  Topic Date Due   Pneumococcal Vaccine 25-36 Years old (1 of 2 - PCV) Never done   Hepatitis B Vaccines (1 of 3 - 19+ 3-dose series) Never done   HPV VACCINES (1 - 3-dose SCDM series) Never done   COVID-19 Vaccine (1 - 2024-25 season) Never done     Review of Systems:  Pertinent items are noted in HPI.   Objective:  Physical Exam Blood pressure 118/83, pulse (!) 118, weight 119 lb (54 kg), last menstrual period 04/14/2024. VS reviewed, nursing note reviewed,  Constitutional: well developed, well nourished, no distress HEENT: normocephalic Pulm/chest wall: normal effort Breast Exam: deferred Neuro: alert and oriented  Skin: warm, dry Psych: affect normal Pelvic exam: Pelvic: normal appearing vulva with no masses, tenderness or lesions, swab collected   Assessment & Plan:  1. Early stage of pregnancy (Primary) 2. 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 Vaginal discharge Swab collected to r/o  - [facility]   Future Appointments  Date Time Provider Department Center  07/21/2024  3:10 PM Jayne Vonn DEL, MD [facility]     Nidia Daring, FNP 11:30 AM

## 2024-07-08 ENCOUNTER — Ambulatory Visit: Payer: Self-pay | Admitting: Obstetrics and Gynecology

## 2024-07-08 LAB — CERVICOVAGINAL ANCILLARY ONLY
Bacterial Vaginitis (gardnerella): NEGATIVE
Candida Glabrata: NEGATIVE
Candida Vaginitis: NEGATIVE
Comment: NEGATIVE
Comment: NEGATIVE
Comment: NEGATIVE

## 2024-07-08 LAB — PROGESTERONE: Progesterone: 14.3 ng/mL

## 2024-07-21 ENCOUNTER — Ambulatory Visit: Admitting: Obstetrics & Gynecology

## 2024-07-21 ENCOUNTER — Encounter: Payer: Self-pay | Admitting: Obstetrics & Gynecology

## 2024-07-21 VITALS — BP 133/78 | HR 80 | Wt 119.0 lb

## 2024-07-21 DIAGNOSIS — Z3A00 Weeks of gestation of pregnancy not specified: Secondary | ICD-10-CM

## 2024-07-21 DIAGNOSIS — O468X1 Other antepartum hemorrhage, first trimester: Secondary | ICD-10-CM | POA: Diagnosis not present

## 2024-07-21 DIAGNOSIS — O418X10 Other specified disorders of amniotic fluid and membranes, first trimester, not applicable or unspecified: Secondary | ICD-10-CM | POA: Diagnosis not present

## 2024-07-21 NOTE — Progress Notes (Signed)
 Follow up appointment scan  Chief Complaint  Patient presents with   Follow-up    Blood pressure 133/78, pulse 80, weight 119 lb (54 kg), last menstrual period 04/14/2024.  Had small [facility] noted on 07/07/24 No further bleeding noted by patient Here for interval follow up POC scan  Singleton viable FHR >140 no evidence of SCH today  Make NOB appt to start care Discussed strategies for early prgnancy N/V  MEDS ordered this encounter: No orders of the defined types were placed in this encounter.   Orders for this encounter: No orders of the defined types were placed in this encounter.   Impression + Management Plan   ICD-10-CM   1. Subchorionic hematoma in first trimester,resolved  O41.8X10    O46.8X1       Follow Up: Return in about 2 weeks (around 08/04/2024) for NOB visit with Tish.     All questions were answered.  Past Medical History:  Diagnosis Date   Asthma    MTHFR gene mutation    Restless leg syndrome     Past Surgical History:  Procedure Laterality Date   APPENDECTOMY     WISDOM TOOTH EXTRACTION      OB History     Gravida  3   Para  1   Term  0   Preterm  1   AB  1   Living  1      SAB  1   IAB  0   Ectopic  0   Multiple  0   Live Births  1           Allergies  Allergen Reactions   Sulfa Antibiotics    Penicillins Hives and Rash    Social History   Socioeconomic History   Marital status: Married    Spouse name: Not on file   Number of children: Not on file   Years of education: Not on file   Highest education level: Not on file  Occupational History   Not on file  Tobacco Use   Smoking status: Never   Smokeless tobacco: Never  Vaping Use   Vaping status: Never Used  Substance and Sexual Activity   Alcohol use: Not Currently    Comment: very rarely   Drug use: No   Sexual activity: Yes    Birth control/protection: None  Other Topics Concern   Not on file  Social History Narrative   Not on file    Social Drivers of Health   Financial Resource Strain: Low Risk  (10/30/2021)   Overall Financial Resource Strain (CARDIA)    Difficulty of Paying Living Expenses: Not hard at all  Food Insecurity: No Food Insecurity (10/30/2021)   Hunger Vital Sign    Worried About Running Out of Food in the Last Year: Never true    Ran Out of Food in the Last Year: Never true  Transportation Needs: No Transportation Needs (10/30/2021)   PRAPARE - Administrator, Civil Service (Medical): No    Lack of Transportation (Non-Medical): No  Physical Activity: Inactive (10/30/2021)   Exercise Vital Sign    Days of Exercise per Week: 0 days    Minutes of Exercise per Session: 0 min  Stress: No Stress Concern Present (10/30/2021)   [employer] of Occupational Health - Occupational Stress Questionnaire    Feeling of Stress : Not at all  Social Connections: Socially Integrated (10/30/2021)   Social Connection and Isolation Panel    Frequency of  Communication with Friends and Family: Once a week    Frequency of Social Gatherings with Friends and Family: Twice a week    Attends Religious Services: More than 4 times per year    Active Member of [employer] or Organizations: Yes    Attends Engineer, structural: More than 4 times per year    Marital Status: Married    Family History  Problem Relation Age of Onset   Alzheimer's disease Paternal Grandfather    Hyperlipidemia Father    Osteoporosis Mother    Other Brother        has hearing aids   Other Son        eye issue

## 2024-07-22 ENCOUNTER — Encounter: Payer: Self-pay | Admitting: Obstetrics & Gynecology

## 2024-08-11 ENCOUNTER — Encounter: Payer: Self-pay | Admitting: Advanced Practice Midwife

## 2024-08-11 ENCOUNTER — Encounter: Admitting: *Deleted

## 2024-08-11 ENCOUNTER — Other Ambulatory Visit (HOSPITAL_COMMUNITY)
Admission: RE | Admit: 2024-08-11 | Discharge: 2024-08-11 | Disposition: A | Discharge status: Home / Self Care | Source: Ambulatory Visit | Attending: Advanced Practice Midwife | Admitting: Advanced Practice Midwife

## 2024-08-11 ENCOUNTER — Ambulatory Visit (INDEPENDENT_AMBULATORY_CARE_PROVIDER_SITE_OTHER): Admitting: Advanced Practice Midwife

## 2024-08-11 VITALS — BP 110/77 | HR 88 | Wt 121.0 lb

## 2024-08-11 DIAGNOSIS — Z363 Encounter for antenatal screening for malformations: Secondary | ICD-10-CM

## 2024-08-11 DIAGNOSIS — O99282 Endocrine, nutritional and metabolic diseases complicating pregnancy, second trimester: Secondary | ICD-10-CM

## 2024-08-11 DIAGNOSIS — Z131 Encounter for screening for diabetes mellitus: Secondary | ICD-10-CM | POA: Diagnosis not present

## 2024-08-11 DIAGNOSIS — E038 Other specified hypothyroidism: Secondary | ICD-10-CM

## 2024-08-11 DIAGNOSIS — Z3A12 12 weeks gestation of pregnancy: Secondary | ICD-10-CM

## 2024-08-11 DIAGNOSIS — O09299 Supervision of pregnancy with other poor reproductive or obstetric history, unspecified trimester: Secondary | ICD-10-CM | POA: Insufficient documentation

## 2024-08-11 DIAGNOSIS — Z3480 Encounter for supervision of other normal pregnancy, unspecified trimester: Secondary | ICD-10-CM

## 2024-08-11 DIAGNOSIS — Z8759 Personal history of other complications of pregnancy, childbirth and the puerperium: Secondary | ICD-10-CM | POA: Diagnosis not present

## 2024-08-11 DIAGNOSIS — Z3490 Encounter for supervision of normal pregnancy, unspecified, unspecified trimester: Secondary | ICD-10-CM | POA: Insufficient documentation

## 2024-08-11 DIAGNOSIS — Z8632 Personal history of gestational diabetes: Secondary | ICD-10-CM

## 2024-08-11 MED ORDER — ASPIRIN 81 MG PO TBEC: 81.0000 mg | DELAYED_RELEASE_TABLET | Freq: Every day | ORAL | 6 refills | Status: AC

## 2024-08-11 NOTE — Progress Notes (Signed)
 INITIAL OBSTETRICAL VISIT Patient name: Anna Guzman MRN [id number]  Date of birth: [birth year] Chief Complaint:   Initial Prenatal Visit  History of Present Illness:   Anna Guzman is a 33 y.o. H6E9888  female at [redacted]w[redacted]d by US  at 7 weeks with an Estimated Date of Delivery: 02/19/25 being seen today for her initial obstetrical visit.   Her obstetrical history is significant for severe preeclampsia w/36+ week IOL; hypothyroidism on Synthroid ,  early SAB in January.  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     08/11/2024   11:23 AM 08/01/2021   10:04 AM 03/25/2021    3:31 PM 10/06/2018    9:35 AM 12/28/2017    8:52 AM  Depression screen PHQ 2/9  Decreased Interest 0 0 0 0 0  Down, Depressed, Hopeless 0 0 0 0 0  PHQ - 2 Score 0 0 0 0 0  Altered sleeping 0 0 0 0   Tired, decreased energy 0 1 1 0   Change in appetite 0 0 0 0   Feeling bad or failure about yourself  0 0 0 0   Trouble concentrating 0 0 0 0   Moving slowly or fidgety/restless 0 0 0 0   Suicidal thoughts 0 0 0 0   PHQ-9 Score 0 1 1 0     Patient's last menstrual period was 04/14/2024. Last pap  Diagnosis  Date Value Ref Range Status  12/29/2023 (A)  Final   - Atypical squamous cells of undetermined significance (ASC-US )   Review of Systems:   Pertinent items are noted in HPI Denies cramping/contractions, leakage of fluid, vaginal bleeding, abnormal vaginal discharge w/ itching/odor/irritation, headaches, visual changes, shortness of breath, chest pain, abdominal pain, severe nausea/vomiting, or problems with urination or bowel movements unless otherwise stated above.  Pertinent History Reviewed:  Reviewed past medical,surgical, social, obstetrical and family history.  Reviewed problem list, medications and allergies. OB History  Gravida Para Term Preterm AB Living  3 1 0 1 1 1   SAB IAB Ectopic Multiple Live Births  1 0 0 0 1    # Outcome Date GA Lbr Len/2nd  Weight Sex Type Anes PTL Lv  3 Current           2 SAB 12/10/23          1 Preterm 09/26/21 [redacted]w[redacted]d 09:24 / 00:28 6 lb 14.8 oz (3.141 kg) M Vag-Spont EPI  LIV     Complications: Gestational diabetes, Severe pre-eclampsia   Physical Assessment:   Vitals:   08/11/24 1119  BP: 110/77  Pulse: 88  Weight: 121 lb (54.9 kg)  Body mass index is 20.14 kg/m.       Physical Examination:  General appearance - well appearing, and in no distress  Mental status - alert, oriented to person, place, and time  Psych:  She has a normal mood and affect  Skin - warm and dry, normal color, no suspicious lesions noted  Chest - effort normal  Heart - normal rate and regular rhythm  Abdomen - soft, nontender  Extremities:  No swelling or varicosities noted  FHR: 160 No results found for this or any previous visit (from the past 24 hours).   Indications for ASA therapy (per uptodate) One of the following: Previous pregnancy with preeclampsia, especially early onset and with an adverse outcome Yes     08/11/2024   11:23 AM 08/01/2021   10:04 AM 03/25/2021  3:31 PM 10/06/2018    9:35 AM 12/28/2017    8:52 AM  Depression screen PHQ 2/9  Decreased Interest 0 0 0 0 0  Down, Depressed, Hopeless 0 0 0 0 0  PHQ - 2 Score 0 0 0 0 0  Altered sleeping 0 0 0 0   Tired, decreased energy 0 1 1 0   Change in appetite 0 0 0 0   Feeling bad or failure about yourself  0 0 0 0   Trouble concentrating 0 0 0 0   Moving slowly or fidgety/restless 0 0 0 0   Suicidal thoughts 0 0 0 0   PHQ-9 Score 0 1 1 0         08/11/2024   11:24 AM 08/01/2021   10:05 AM 03/25/2021    3:31 PM  GAD 7 : Generalized Anxiety Score  Nervous, Anxious, on Edge 0 0 0  Control/stop worrying 0 0 0  Worry too much - different things 0 0 0  Trouble relaxing 0 0 0  Restless 0 0 0  Easily annoyed or irritable 0 0 0  Afraid - awful might happen 0 0 0  Total GAD 7 Score 0 0 0      Assessment & Plan:  1) Low-Risk Pregnancy [id number] at  [redacted]w[redacted]d with an Estimated Date of Delivery: 02/19/25   2) Initial OB visit    1. History of severe pre-eclampsia ASA 81mg  - Protein / creatinine ratio, urine - Comprehensive metabolic panel with GFR  2. Other specified hypothyroidism Continue synthroid , TSH q trimester - TSH  3. [redacted] weeks gestation of pregnancy  - Urine Culture - Hemoglobin A1c - CHL AMB BABYSCRIPTS SCHEDULE OPTIMIZATION - CBC/D/Plt+RPR+Rh+ABO+RubIgG... - Cervicovaginal ancillary only - TSH - Protein / creatinine ratio, urine - Comprehensive metabolic panel with GFR  4. Diabetes mellitus screening  - Hemoglobin A1c  5. Supervision of other normal pregnancy, antepartum (Primary)  - Urine Culture - Hemoglobin A1c - CHL AMB BABYSCRIPTS SCHEDULE OPTIMIZATION - CBC/D/Plt+RPR+Rh+ABO+RubIgG... - Cervicovaginal ancillary only - TSH - Protein / creatinine ratio, urine - Comprehensive metabolic panel with GFR  6. History of gestational diabetes in prior pregnancy, currently pregnant A1C today       Meds: No orders of the defined types were placed in this encounter.   Initial labs obtained Continue prenatal vitamins Reviewed n/v relief measures and warning s/s to report Reviewed recommended weight gain based on pre-gravid BMI Encouraged well-balanced diet Genetic & carrier screening discussed: declines Panorama, NT/IT, AFP, and Horizon ,   Ultrasound discussed; fetal survey: requested CCNC completed> form faxed if has or is planning to apply for medicaid The nature of [employer] for [employer] with multiple MDs and other Advanced Practice Providers was explained to patient; also emphasized that fellows, residents, and students are part of our team. Has home bp cuff. . Check bp weekly, let us  know if >140/90.        Cathlean Cresenzo-Dishmon 11:36 AM

## 2024-08-11 NOTE — Patient Instructions (Signed)
 Heather Bloch, I greatly value your feedback.  If you receive a survey following your visit with us  today, we appreciate you taking the time to fill it out.  Thanks, Sherrell Ely, DNP, CNM  [facility] HAS MOVED!!! It is now [facility] ([address] [place], [place] [address]) Entrance located off of [address] Free 24/7 valet parking   Nausea & Vomiting Have saltine crackers or pretzels by your bed and eat a few bites before you raise your head out of bed in the morning Eat small frequent meals throughout the day instead of large meals Drink plenty of fluids throughout the day to stay hydrated, just don't drink a lot of fluids with your meals.  This can make your stomach fill up faster making you feel sick Do not brush your teeth right after you eat Products with real ginger are good for nausea, like ginger ale and ginger hard candy Make sure it says made with real ginger! Sucking on sour candy like lemon heads is also good for nausea If your prenatal vitamins make you nauseated, take them at night so you will sleep through the nausea Sea Bands If you feel like you need medicine for the nausea & vomiting please let us  know If you are unable to keep any fluids or food down please let us  know   Constipation Drink plenty of fluid, preferably water, throughout the day Eat foods high in fiber such as fruits, vegetables, and grains Exercise, such as walking, is a good way to keep your bowels regular Drink warm fluids, especially warm prune juice, or decaf coffee Eat a 1/2 cup of real oatmeal (not instant), 1/2 cup applesauce, and 1/2-1 cup warm prune juice every day If needed, you may take Colace (docusate sodium ) stool softener once or twice a day to help keep the stool soft.  If you still are having problems with constipation, you may take Miralax once daily as needed to help keep your bowels regular.   Home Blood Pressure Monitoring for  Patients   Your provider has recommended that you check your blood pressure (BP) at least once a week at home. If you do not have a blood pressure cuff at home, one will be provided for you. Contact your provider if you have not received your monitor within 1 week.   Helpful Tips for Accurate Home Blood Pressure Checks  Don't smoke, exercise, or drink caffeine 30 minutes before checking your BP Use the restroom before checking your BP (a full bladder can raise your pressure) Relax in a comfortable upright chair Feet on the ground Left arm resting comfortably on a flat surface at the level of your heart Legs uncrossed Back supported Sit quietly and don't talk Place the cuff on your bare arm Adjust snuggly, so that only two fingertips can fit between your skin and the top of the cuff Check 2 readings separated by at least one minute Keep a log of your BP readings For a visual, please reference this diagram: http://ccnc.care/bpdiagram  Provider Name: [facility]     Phone: [phone]  Zone 1: ALL CLEAR  Continue to monitor your symptoms:  BP reading is less than 140 (top number) or less than 90 (bottom number)  No right upper stomach pain No headaches or seeing spots No feeling nauseated or throwing up No swelling in face and hands  Zone 2: CAUTION Call your doctor's office for any of the following:  BP  reading is greater than 140 (top number) or greater than 90 (bottom number)  Stomach pain under your ribs in the middle or right side Headaches or seeing spots Feeling nauseated or throwing up Swelling in face and hands  Zone 3: EMERGENCY  Seek immediate medical care if you have any of the following:  BP reading is greater than160 (top number) or greater than 110 (bottom number) Severe headaches not improving with Tylenol  Serious difficulty catching your breath Any worsening symptoms from Zone 2    First Trimester of Pregnancy The first trimester of pregnancy is from  week 1 until the end of week 12 (months 1 through 3). A week after a sperm fertilizes an egg, the egg will implant on the wall of the uterus. This embryo will begin to develop into a baby. Genes from you and your partner are forming the baby. The female genes determine whether the baby is a boy or a girl. At 6-8 weeks, the eyes and face are formed, and the heartbeat can be seen on ultrasound. At the end of 12 weeks, all the baby's organs are formed.  Now that you are pregnant, you will want to do everything you can to have a healthy baby. Two of the most important things are to get good prenatal care and to follow your health care provider's instructions. Prenatal care is all the medical care you receive before the baby's birth. This care will help prevent, find, and treat any problems during the pregnancy and childbirth. BODY CHANGES Your body goes through many changes during pregnancy. The changes vary from woman to woman.  You may gain or lose a couple of pounds at first. You may feel sick to your stomach (nauseous) and throw up (vomit). If the vomiting is uncontrollable, call your health care provider. You may tire easily. You may develop headaches that can be relieved by medicines approved by your health care provider. You may urinate more often. Painful urination may mean you have a bladder infection. You may develop heartburn as a result of your pregnancy. You may develop constipation because certain hormones are causing the muscles that push waste through your intestines to slow down. You may develop hemorrhoids or swollen, bulging veins (varicose veins). Your breasts may begin to grow larger and become tender. Your nipples may stick out more, and the tissue that surrounds them (areola) may become darker. Your gums may bleed and may be sensitive to brushing and flossing. Dark spots or blotches (chloasma, mask of pregnancy) may develop on your face. This will likely fade after the baby is  born. Your menstrual periods will stop. You may have a loss of appetite. You may develop cravings for certain kinds of food. You may have changes in your emotions from day to day, such as being excited to be pregnant or being concerned that something may go wrong with the pregnancy and baby. You may have more vivid and strange dreams. You may have changes in your hair. These can include thickening of your hair, rapid growth, and changes in texture. Some women also have hair loss during or after pregnancy, or hair that feels dry or thin. Your hair will most likely return to normal after your baby is born. WHAT TO EXPECT AT YOUR PRENATAL VISITS During a routine prenatal visit: You will be weighed to make sure you and the baby are growing normally. Your blood pressure will be taken. Your abdomen will be measured to track your baby's growth. The fetal heartbeat  will be listened to starting around week 10 or 12 of your pregnancy. Test results from any previous visits will be discussed. Your health care provider may ask you: How you are feeling. If you are feeling the baby move. If you have had any abnormal symptoms, such as leaking fluid, bleeding, severe headaches, or abdominal cramping. If you have any questions. Other tests that may be performed during your first trimester include: Blood tests to find your blood type and to check for the presence of any previous infections. They will also be used to check for low iron levels (anemia) and Rh antibodies. Later in the pregnancy, blood tests for diabetes will be done along with other tests if problems develop. Urine tests to check for infections, diabetes, or protein in the urine. An ultrasound to confirm the proper growth and development of the baby. An amniocentesis to check for possible genetic problems. Fetal screens for spina bifida and Down syndrome. You may need other tests to make sure you and the baby are doing well. HOME CARE  INSTRUCTIONS  Medicines Follow your health care provider's instructions regarding medicine use. Specific medicines may be either safe or unsafe to take during pregnancy. Take your prenatal vitamins as directed. If you develop constipation, try taking a stool softener if your health care provider approves. Diet Eat regular, well-balanced meals. Choose a variety of foods, such as meat or vegetable-based protein, fish, milk and low-fat dairy products, vegetables, fruits, and whole grain breads and cereals. Your health care provider will help you determine the amount of weight gain that is right for you. Avoid raw meat and uncooked cheese. These carry germs that can cause birth defects in the baby. Eating four or five small meals rather than three large meals a day may help relieve nausea and vomiting. If you start to feel nauseous, eating a few soda crackers can be helpful. Drinking liquids between meals instead of during meals also seems to help nausea and vomiting. If you develop constipation, eat more high-fiber foods, such as fresh vegetables or fruit and whole grains. Drink enough fluids to keep your urine clear or pale yellow. Activity and Exercise Exercise only as directed by your health care provider. Exercising will help you: Control your weight. Stay in shape. Be prepared for labor and delivery. Experiencing pain or cramping in the lower abdomen or low back is a good sign that you should stop exercising. Check with your health care provider before continuing normal exercises. Try to avoid standing for long periods of time. Move your legs often if you must stand in one place for a long time. Avoid heavy lifting. Wear low-heeled shoes, and practice good posture. You may continue to have sex unless your health care provider directs you otherwise. Relief of Pain or Discomfort Wear a good support bra for breast tenderness.   Take warm sitz baths to soothe any pain or discomfort caused by  hemorrhoids. Use hemorrhoid cream if your health care provider approves.   Rest with your legs elevated if you have leg cramps or low back pain. If you develop varicose veins in your legs, wear support hose. Elevate your feet for 15 minutes, 3-4 times a day. Limit salt in your diet. Prenatal Care Schedule your prenatal visits by the twelfth week of pregnancy. They are usually scheduled monthly at first, then more often in the last 2 months before delivery. Write down your questions. Take them to your prenatal visits. Keep all your prenatal visits as directed by  your health care provider. Safety Wear your seat belt at all times when driving. Make a list of emergency phone numbers, including numbers for family, friends, the hospital, and police and fire departments. General Tips Ask your health care provider for a referral to a local prenatal education class. Begin classes no later than at the beginning of month 6 of your pregnancy. Ask for help if you have counseling or nutritional needs during pregnancy. Your health care provider can offer advice or refer you to specialists for help with various needs. Do not use hot tubs, steam rooms, or saunas. Do not douche or use tampons or scented sanitary pads. Do not cross your legs for long periods of time. Avoid cat litter boxes and soil used by cats. These carry germs that can cause birth defects in the baby and possibly loss of the fetus by miscarriage or stillbirth. Avoid all smoking, herbs, alcohol, and medicines not prescribed by your health care provider. Chemicals in these affect the formation and growth of the baby. Schedule a dentist appointment. At home, brush your teeth with a soft toothbrush and be gentle when you floss. SEEK MEDICAL CARE IF:  You have dizziness. You have mild pelvic cramps, pelvic pressure, or nagging pain in the abdominal area. You have persistent nausea, vomiting, or diarrhea. You have a bad smelling vaginal  discharge. You have pain with urination. You notice increased swelling in your face, hands, legs, or ankles. SEEK IMMEDIATE MEDICAL CARE IF:  You have a fever. You are leaking fluid from your vagina. You have spotting or bleeding from your vagina. You have severe abdominal cramping or pain. You have rapid weight gain or loss. You vomit blood or material that looks like coffee grounds. You are exposed to [place] measles and have never had them. You are exposed to fifth disease or chickenpox. You develop a severe headache. You have shortness of breath. You have any kind of trauma, such as from a fall or a car accident. Document Released: 11/18/2001 Document Revised: 04/10/2014 Document Reviewed: 10/04/2013 [employer] Patient Information 2015 [place], [place]. This information is not intended to replace advice given to you by your health care provider. Make sure you discuss any questions you have with your health care provider.  ADDITIONAL HEALTHCARE OPTIONS FOR PATIENTS  [facility] / e-Visit: [email or web address]         [facility]: [phone]  [facility]: [phone]                   [facility]: [phone]     Safe Medications in Pregnancy   Acne: Benzoyl Peroxide Salicylic Acid  Backache/Headache: Tylenol : 2 regular strength every 4 hours OR              2 Extra strength every 6 hours  Colds/Coughs/Allergies: Benadryl  (alcohol free) 25 mg every 6 hours as needed Breath right strips Claritin Cepacol throat lozenges Chloraseptic throat spray Cold-Eeze- up to three times per day Cough drops, alcohol free Flonase  (by prescription only) Guaifenesin Mucinex Robitussin DM (plain only, alcohol free) Saline nasal spray/drops Sudafed (pseudoephedrine) & Actifed ** use only after [redacted] weeks gestation and if you do not have high blood pressure Tylenol  Vicks Vaporub Zinc  lozenges Zyrtec   Constipation: Colace Ducolax suppositories Fleet enema Glycerin  suppositories Metamucil Milk of magnesia Miralax Senokot Smooth move tea  Diarrhea: Kaopectate Imodium A-D  *NO pepto Bismol  Hemorrhoids: Anusol Anusol HC Preparation H Tucks  Indigestion: Tums Maalox Mylanta  Zantac  Pepcid  Insomnia: Benadryl  (alcohol free) 25mg  every 6 hours as needed Tylenol  PM Unisom, no Gelcaps  Leg Cramps: Tums MagGel  Nausea/Vomiting:  Bonine Dramamine Emetrol Ginger extract Sea bands Meclizine  Nausea medication to take during pregnancy:  Unisom (doxylamine succinate 25 mg tablets) Take one tablet daily at bedtime. If symptoms are not adequately controlled, the dose can be increased to a maximum recommended dose of two tablets daily (1/2 tablet in the morning, 1/2 tablet mid-afternoon and one at bedtime). Vitamin B6 100mg  tablets. Take one tablet twice a day (up to 200 mg per day).  Skin Rashes: Aveeno products Benadryl  cream or 25mg  every 6 hours as needed Calamine Lotion 1% cortisone cream  Yeast infection: Gyne-lotrimin 7 Monistat 7   **If taking multiple medications, please check labels to avoid duplicating the same active ingredients **take medication as directed on the label ** Do not exceed 4000 mg of tylenol  in 24 hours **Do not take medications that contain aspirin  or ibuprofen    [phone] is the phone number for Pregnancy Classes or hospital tours at [facility].   You will be referred to  [email or web address]   for more information on childbirth classes   At this site you may register for classes. You may sign up for a waiting list if classes are full. Please SIGN UP FOR THIS!.   When the waiting list becomes long, sometimes new classes can be added.  [facility] Call to Register: [phone] or  [phone]   or   Register Online: [email or web address] THESE CLASSES FILL UP VERY QUICKLY, SO SIGN UP AS SOON AS YOU CAN!!! Please visit [facility] pregnancy website at [email or web address]  Childbirth Classes  Option 1: Birth & Baby Series Series of 3 weekly classes, on the same day of the week (can choose Mon-Thurs) from 6-9pm Helps you and your support person prepare for childbirth Reviews newborn care, labor & birth, cesarean birth, pain management, and comfort techniques Cost: $60 per couple for insured or self-pay, $30 per couple for Medicaid  Option 2: Weekend Birth & Baby This class is a weekend version of our Birth & Baby series.  It is designed for parents who have a difficult time fitting several weeks of classes into their schedule.   Covers the care of your newborn and the basics of labor and childbirth Friday 6:30pm-8:30pm Saturday 9am-4pm, includes lunch for you and your partner  Cost: $75 per couple for insured or self-pay, $30 per couple for Medicaid  Option 3: Natural Childbirth This series of 5 weekly classes is for expectant parents who want to learn and practice natural methods of coping with the process of labor and childbirth.  Can choose Mon or Tues, 7-9pm.   Covers relaxation, breathing, massage, visualization, role of the partner, and helpful positioning Participants learn how to be confident in their body's ability to give birth. Class empowers and helps parents make informed decisions about care. Includes discussion that will help new parents transition into the immediate postpartum period.  Cost: $75 per couple for insured or self-pay, $30 per couple for Medicaid  Option 4: Online Birth & Baby This online class offers you the freedom to complete a Birth & Baby series in the comfort of your own home.  The flexibility of this option allows you to review sections at your own pace, at times convenient to you and your support people.  It includes additional  video information, animations, quizzes and extended activities. Get organized  with helpful eClass tools, checklists, and trackers.  Cost: $60 for 60 days of online access                                                                            Other Available Classes  Baby & Me Enjoy this time to discuss newborn & infant parenting topics and family adjustment issues with other new mothers in a relaxed environment. Each week brings a new speaker or baby-centered activity. We encourage mothers and their babies (birth to crawling) to join us . You are welcome to visit this group even if you haven't delivered yet! It's wonderful to make new friends early and watch other moms interact with their babies. No registration or fee.  Big Brother/Big Sister Let your children share in the joy of a new brother or sister in this special class designed just for them. Discussion includes how families care for babies: swaddling, holding, diapering, safety, as well as how they can be helpful in their new role. This class is designed for children ages 2 to 3, but any age is welcome. Please register each child individually. $5 Breastfeeding Support Group This group is a mother-to-mother support circle where moms have the opportunity to share their breastfeeding experiences. A Breastfeeding Support nurse is present for questions and concerns. An infant scale is available for weight checks. No fee or registration.  Breastfeeding Your Baby Breastfeeding is a special time for mother and child. This class will help you feel ready to begin this important relationship. Your partner is encouraged to attend with you. Learn what to expect and feel more confident in the first days of breastfeeding your newborn. This class also addresses the most common fears and challenges of breastfeeding during the first few weeks, months, and beyond. $30 per couple Caring for Baby This class is for expectant and adoptive parents who want to  learn and practice the most up-to-date newborn care for their babies. Focus is on birth through first six weeks of life. Topics include feeding, bathing, diapering, crying, umbilical cord care, circumcision care and safe sleep. Parents learn how to recognize symptoms of illness and when to call the pediatrician. Register only the mom-to-be and your partner can plan to come with you. (*Note: This class is included in the Birth & Baby series and the Weekend Birth & Baby classes.) $10 per couple Comfort Techniques & Tour This 2-hour interactive class is designed for those who either do not wish to take the Birth & Baby series or for those who prefer our online childbirth class, but don't want to miss the opportunity to learn and practice hands-on techniques. These skills can help relieve some of the discomfort of labor and encourage your baby to rotate toward the best position for birth. You and your partner will be able to try a variety of labor positions with birth balls and rebozos as well as practice breathing, relaxation, and visual techniques. $20 per couple [employer] [facility] This course offers Dads-to-be the tools and knowledge needed to feel confident on their journey to becoming new fathers. Experienced dads, who have been trained as coaches, teach dads-to-be how to hold, comfort, diapers, swaddle and play with their infant while being able  to support the new mom as well. $25 Grandparent Love Expecting a grandbaby? Learn about the latest infant care and safety recommendations and ways to support your own child as he or she transitions into the parenting role. $10 per person Infant and Child CPR Parents, grandparents, babysitters, and friends learn Cardio-Pulmonary Resuscitation skills for infants and children. You will also learn how to treat both conscious and unconscious choking infants and children. Register each participant individually. (Note: This Family & Friends program does not offer  certification.) $20 per person Marvelous Multiples Expecting twins, triplets, or more? This free 2-hour class covers the differences in labor, birth, parenting, and breastfeeding issues that face multiples' parents.  Maternity Care Center Virtual Tour  Online virtual tour of the new [place] [facility] Talk This free mom-led group offers support and connection to mothers as they journey through the adjustments and struggles of that sometimes overwhelming first year after the birth of a child. A member of our staff will be present to share resources and additional support if needed, as you care for yourself and baby. You are welcome to visit this group before you deliver! It's wonderful to meet new friends early and watch other moms interact with their babies.  Waterbirth Class Interested in a waterbirth? This free informational class will help you discover whether waterbirth is the right fit for you and is required if you are planning a waterbirth. Education about waterbirth itself, supplies you may need, and what you may need from your support team is included in this class. Partners are encouraged to come.

## 2024-08-12 LAB — CBC/D/PLT+RPR+RH+ABO+RUBIGG...
Antibody Screen: NEGATIVE
Basophils Absolute: 0 x10E3/uL (ref 0.0–0.2)
Basos: 0 %
EOS (ABSOLUTE): 0.1 x10E3/uL (ref 0.0–0.4)
Eos: 1 %
HCV Ab: NONREACTIVE
HIV Screen 4th Generation wRfx: NONREACTIVE
Hematocrit: 38.7 % (ref 34.0–46.6)
Hemoglobin: 12.6 g/dL (ref 11.1–15.9)
Hepatitis B Surface Ag: NEGATIVE
Immature Grans (Abs): 0 x10E3/uL (ref 0.0–0.1)
Immature Granulocytes: 0 %
Lymphocytes Absolute: 2.9 x10E3/uL (ref 0.7–3.1)
Lymphs: 34 %
MCH: 29.7 pg (ref 26.6–33.0)
MCHC: 32.6 g/dL (ref 31.5–35.7)
MCV: 91 fL (ref 79–97)
Monocytes Absolute: 0.5 x10E3/uL (ref 0.1–0.9)
Monocytes: 6 %
Neutrophils Absolute: 5.1 x10E3/uL (ref 1.4–7.0)
Neutrophils: 59 %
Platelets: 352 x10E3/uL (ref 150–450)
RBC: 4.24 x10E6/uL (ref 3.77–5.28)
RDW: 12.4 % (ref 11.7–15.4)
RPR Ser Ql: NONREACTIVE
Rh Factor: POSITIVE
Rubella Antibodies, IGG: 4.76 {index} (ref >0.99)
WBC: 8.6 x10E3/uL (ref 3.4–10.8)

## 2024-08-12 LAB — COMPREHENSIVE METABOLIC PANEL WITH GFR
ALT: 12 IU/L (ref 0–32)
AST: 16 IU/L (ref 0–40)
Albumin: 4.5 g/dL (ref 3.9–4.9)
Alkaline Phosphatase: 56 IU/L (ref 44–121)
BUN/Creatinine Ratio: 13 (ref 9–23)
BUN: 7 mg/dL (ref 6–20)
Bilirubin Total: 0.2 mg/dL (ref 0.0–1.2)
CO2: 20 mmol/L (ref 20–29)
Calcium: 9.7 mg/dL (ref 8.7–10.2)
Chloride: 102 mmol/L (ref 96–106)
Creatinine, Ser: 0.53 mg/dL — ABNORMAL LOW (ref 0.57–1.00)
Globulin, Total: 2.8 g/dL (ref 1.5–4.5)
Glucose: 71 mg/dL (ref 70–99)
Potassium: 4.6 mmol/L (ref 3.5–5.2)
Sodium: 136 mmol/L (ref 134–144)
Total Protein: 7.3 g/dL (ref 6.0–8.5)
eGFR: 125 mL/min/1.73 (ref >59)

## 2024-08-12 LAB — PROTEIN / CREATININE RATIO, URINE
Creatinine, Urine: 29.8 mg/dL
Protein, Ur: <4 mg/dL

## 2024-08-12 LAB — HEMOGLOBIN A1C
Est. average glucose Bld gHb Est-mCnc: 105 mg/dL
Hgb A1c MFr Bld: 5.3 % (ref 4.8–5.6)

## 2024-08-12 LAB — HCV INTERPRETATION

## 2024-08-12 LAB — CERVICOVAGINAL ANCILLARY ONLY
Chlamydia: NEGATIVE
Comment: NEGATIVE
Comment: NORMAL
Neisseria Gonorrhea: NEGATIVE

## 2024-08-12 LAB — TSH: TSH: 0.97 u[IU]/mL (ref 0.450–4.500)

## 2024-08-13 LAB — URINE CULTURE: Organism ID, Bacteria: NO GROWTH

## 2024-08-15 ENCOUNTER — Ambulatory Visit: Payer: Self-pay | Admitting: Advanced Practice Midwife

## 2024-08-18 MED ORDER — FERROUS SULFATE 325 (65 FE) MG PO TABS: 325.0000 mg | ORAL_TABLET | ORAL | 2 refills | Status: DC

## 2024-09-08 ENCOUNTER — Ambulatory Visit (INDEPENDENT_AMBULATORY_CARE_PROVIDER_SITE_OTHER): Admitting: Advanced Practice Midwife

## 2024-09-08 ENCOUNTER — Encounter: Payer: Self-pay | Admitting: Advanced Practice Midwife

## 2024-09-08 VITALS — BP 116/81 | HR 92 | Wt 128.0 lb

## 2024-09-08 DIAGNOSIS — Z3482 Encounter for supervision of other normal pregnancy, second trimester: Secondary | ICD-10-CM

## 2024-09-08 DIAGNOSIS — Z3480 Encounter for supervision of other normal pregnancy, unspecified trimester: Secondary | ICD-10-CM

## 2024-09-08 DIAGNOSIS — Z3A16 16 weeks gestation of pregnancy: Secondary | ICD-10-CM | POA: Diagnosis not present

## 2024-09-08 DIAGNOSIS — E039 Hypothyroidism, unspecified: Secondary | ICD-10-CM | POA: Diagnosis not present

## 2024-09-08 NOTE — Patient Instructions (Signed)
Anna Guzman, I greatly value your feedback.  If you receive a survey following your visit with [place] today, we appreciate you taking the time to fill it out.  Thanks, Anna Guzman, CNM     [facility] HAS MOVED!!! It is now [facility] at [facility] ([address] [place], [place] [address]) Entrance located off of [address] Free 24/7 valet parking   Go to [email or web address] to register for FREE online childbirth classes    Second Trimester of Pregnancy The second trimester is from week 14 through week 27 (months 4 through 6). The second trimester is often a time when you feel your best. Your body has adjusted to being pregnant, and you begin to feel better physically. Usually, morning sickness has lessened or quit completely, you may have more energy, and you may have an increase in appetite. The second trimester is also a time when the fetus is growing rapidly. At the end of the sixth month, the fetus is about 9 inches long and weighs about 1 pounds. You will likely begin to feel the baby move (quickening) between 16 and 20 weeks of pregnancy. Body changes during your second trimester Your body continues to go through many changes during your second trimester. The changes vary from woman to woman.  Your weight will continue to increase. You will notice your lower abdomen bulging out.  You may begin to get stretch marks on your hips, abdomen, and breasts.  You may develop headaches that can be relieved by medicines. The medicines should be approved by your health care provider.  You may urinate more often because the fetus is pressing on your bladder.  You may develop or continue to have heartburn as a result of your pregnancy.  You may develop constipation because certain hormones are causing the muscles that push waste through your intestines to slow down.  You may develop hemorrhoids or swollen, bulging veins (varicose veins).  You may have back  pain. This is caused by: ? Weight gain. ? Pregnancy hormones that are relaxing the joints in your pelvis. ? A shift in weight and the muscles that support your balance.  Your breasts will continue to grow and they will continue to become tender.  Your gums may bleed and may be sensitive to brushing and flossing.  Dark spots or blotches (chloasma, mask of pregnancy) may develop on your face. This will likely fade after the baby is born.  A dark line from your belly button to the pubic area (linea nigra) may appear. This will likely fade after the baby is born.  You may have changes in your hair. These can include thickening of your hair, rapid growth, and changes in texture. Some women also have hair loss during or after pregnancy, or hair that feels dry or thin. Your hair will most likely return to normal after your baby is born.  What to expect at prenatal visits During a routine prenatal visit:  You will be weighed to make sure you and the fetus are growing normally.  Your blood pressure will be taken.  Your abdomen will be measured to track your baby's growth.  The fetal heartbeat will be listened to.  Any test results from the previous visit will be discussed.  Your health care provider may ask you:  How you are feeling.  If you are feeling the baby move.  If you have had any abnormal symptoms, such as leaking fluid, bleeding, severe headaches, or abdominal  cramping.  If you are using any tobacco products, including cigarettes, chewing tobacco, and electronic cigarettes.  If you have any questions.  Other tests that may be performed during your second trimester include:  Blood tests that check for: ? Low iron levels (anemia). ? High blood sugar that affects pregnant women (gestational diabetes) between 65 and 28 weeks. ? Rh antibodies. This is to check for a protein on red blood cells (Rh factor).  Urine tests to check for infections, diabetes, or protein in the  urine.  An ultrasound to confirm the proper growth and development of the baby.  An amniocentesis to check for possible genetic problems.  Fetal screens for spina bifida and Down syndrome.  HIV (human immunodeficiency virus) testing. Routine prenatal testing includes screening for HIV, unless you choose not to have this test.  Follow these instructions at home: Medicines  Follow your health care provider's instructions regarding medicine use. Specific medicines may be either safe or unsafe to take during pregnancy.  Take a prenatal vitamin that contains at least 600 micrograms (mcg) of folic acid.  If you develop constipation, try taking a stool softener if your health care provider approves. Eating and drinking  Eat a balanced diet that includes fresh fruits and vegetables, whole grains, good sources of protein such as meat, eggs, or tofu, and low-fat dairy. Your health care provider will help you determine the amount of weight gain that is right for you.  Avoid raw meat and uncooked cheese. These carry germs that can cause birth defects in the baby.  If you have low calcium intake from food, talk to your health care provider about whether you should take a daily calcium supplement.  Limit foods that are high in fat and processed sugars, such as fried and sweet foods.  To prevent constipation: ? Drink enough fluid to keep your urine clear or pale yellow. ? Eat foods that are high in fiber, such as fresh fruits and vegetables, whole grains, and beans. Activity  Exercise only as directed by your health care provider. Most women can continue their usual exercise routine during pregnancy. Try to exercise for 30 minutes at least 5 days a week. Stop exercising if you experience uterine contractions.  Avoid heavy lifting, wear low heel shoes, and practice good posture.  A sexual relationship may be continued unless your health care provider directs you otherwise. Relieving pain and  discomfort  Wear a good support bra to prevent discomfort from breast tenderness.  Take warm sitz baths to soothe any pain or discomfort caused by hemorrhoids. Use hemorrhoid cream if your health care provider approves.  Rest with your legs elevated if you have leg cramps or low back pain.  If you develop varicose veins, wear support hose. Elevate your feet for 15 minutes, 3-4 times a day. Limit salt in your diet. Prenatal Care  Write down your questions. Take them to your prenatal visits.  Keep all your prenatal visits as told by your health care provider. This is important. Safety  Wear your seat belt at all times when driving.  Make a list of emergency phone numbers, including numbers for family, friends, the hospital, and police and fire departments. General instructions  Ask your health care provider for a referral to a local prenatal education class. Begin classes no later than the beginning of month 6 of your pregnancy.  Ask for help if you have counseling or nutritional needs during pregnancy. Your health care provider can offer advice or refer  you to specialists for help with various needs.  Do not use hot tubs, steam rooms, or saunas.  Do not douche or use tampons or scented sanitary pads.  Do not cross your legs for long periods of time.  Avoid cat litter boxes and soil used by cats. These carry germs that can cause birth defects in the baby and possibly loss of the fetus by miscarriage or stillbirth.  Avoid all smoking, herbs, alcohol, and unprescribed drugs. Chemicals in these products can affect the formation and growth of the baby.  Do not use any products that contain nicotine or tobacco, such as cigarettes and e-cigarettes. If you need help quitting, ask your health care provider.  Visit your dentist if you have not gone yet during your pregnancy. Use a soft toothbrush to brush your teeth and be gentle when you floss. Contact a health care provider if:  You  have dizziness.  You have mild pelvic cramps, pelvic pressure, or nagging pain in the abdominal area.  You have persistent nausea, vomiting, or diarrhea.  You have a bad smelling vaginal discharge.  You have pain when you urinate. Get help right away if:  You have a fever.  You are leaking fluid from your vagina.  You have spotting or bleeding from your vagina.  You have severe abdominal cramping or pain.  You have rapid weight gain or weight loss.  You have shortness of breath with chest pain.  You notice sudden or extreme swelling of your face, hands, ankles, feet, or legs.  You have not felt your baby move in over an hour.  You have severe headaches that do not go away when you take medicine.  You have vision changes. Summary  The second trimester is from week 14 through week 27 (months 4 through 6). It is also a time when the fetus is growing rapidly.  Your body goes through many changes during pregnancy. The changes vary from woman to woman.  Avoid all smoking, herbs, alcohol, and unprescribed drugs. These chemicals affect the formation and growth your baby.  Do not use any tobacco products, such as cigarettes, chewing tobacco, and e-cigarettes. If you need help quitting, ask your health care provider.  Contact your health care provider if you have any questions. Keep all prenatal visits as told by your health care provider. This is important. This information is not intended to replace advice given to you by your health care provider. Make sure you discuss any questions you have with your health care provider.

## 2024-09-08 NOTE — Progress Notes (Signed)
   LOW-RISK PREGNANCY VISIT Patient name: Anna Guzman MRN [id number]  Date of birth: 08/06/1991 Chief Complaint:   Routine Prenatal Visit  History of Present Illness:   Anna Guzman is a 33 y.o. [id number] female at [redacted]w[redacted]d with an Estimated Date of Delivery: 02/19/25 being seen today for ongoing management of a low-risk pregnancy.  Today she reports fatigue. Contractions: Not present.  .  Movement: Absent. denies leaking of fluid. Review of Systems:   Pertinent items are noted in HPI Denies abnormal vaginal discharge w/ itching/odor/irritation, headaches, visual changes, shortness of breath, chest pain, abdominal pain, severe nausea/vomiting, or problems with urination or bowel movements unless otherwise stated above. Pertinent History Reviewed:  Reviewed past medical,surgical, social, obstetrical and family history.  Reviewed problem list, medications and allergies.    Physical Assessment:     Vitals:   09/08/24 0829  BP: 116/81  Pulse: 92  Weight: 128 lb (58.1 kg)  Body mass index is 21.3 kg/m.        Physical Examination:   General appearance: Well appearing, and in no distress  Mental status: Alert, oriented to person, place, and time  Skin: Warm & dry  Cardiovascular: Normal heart rate noted  Respiratory: Normal respiratory effort, no distress  Abdomen: Soft, gravid, nontender  Pelvic: Cervical exam deferred         Extremities: Edema: None Chaperone:  N/A   Fetal Status: Fetal Heart Rate (bpm): 150   Movement: Absent      No results found for this or any previous visit (from the past 24 hours).  Assessment & Plan:    Pregnancy: [id number] at [redacted]w[redacted]d 1. Supervision of other normal pregnancy, antepartum (Primary)   2. [redacted] weeks gestation of pregnancy   3. Hypothyroidism, unspecified type TSH  .[id number]     Meds: No orders of the defined types were placed in this encounter.  Labs/procedures today: none  Plan:  Continue routine obstetrical care  Next visit:  prefers will be in person for US     Reviewed: and general obstetric precautions including but not limited to vaginal bleeding, contractions, leaking of fluid and fetal movement were reviewed in detail with the patient.  All questions were answered. Has home bp cuff.. Check bp weekly, let us  know if >140/90.   Follow-up: Return for As scheduled.  Future Appointments  Date Time Provider Department Center  09/30/2024 10:00 AM [facility] US  [facility] None  09/30/2024 10:50 AM Delores Nidia CROME, FNP [facility]    No orders of the defined types were placed in this encounter.  Cathlean Ely DNP, CNM 09/08/2024 8:56 AM

## 2024-09-29 ENCOUNTER — Other Ambulatory Visit

## 2024-09-30 ENCOUNTER — Ambulatory Visit

## 2024-09-30 ENCOUNTER — Ambulatory Visit (INDEPENDENT_AMBULATORY_CARE_PROVIDER_SITE_OTHER): Admitting: Obstetrics and Gynecology

## 2024-09-30 ENCOUNTER — Encounter: Payer: Self-pay | Admitting: Obstetrics and Gynecology

## 2024-09-30 VITALS — BP 118/78 | HR 98 | Wt 131.4 lb

## 2024-09-30 DIAGNOSIS — Z3482 Encounter for supervision of other normal pregnancy, second trimester: Secondary | ICD-10-CM

## 2024-09-30 DIAGNOSIS — Z363 Encounter for antenatal screening for malformations: Secondary | ICD-10-CM | POA: Diagnosis not present

## 2024-09-30 DIAGNOSIS — Z3A19 19 weeks gestation of pregnancy: Secondary | ICD-10-CM

## 2024-09-30 DIAGNOSIS — Z3480 Encounter for supervision of other normal pregnancy, unspecified trimester: Secondary | ICD-10-CM

## 2024-09-30 DIAGNOSIS — Z8759 Personal history of other complications of pregnancy, childbirth and the puerperium: Secondary | ICD-10-CM

## 2024-09-30 DIAGNOSIS — O09292 Supervision of pregnancy with other poor reproductive or obstetric history, second trimester: Secondary | ICD-10-CM

## 2024-09-30 DIAGNOSIS — E039 Hypothyroidism, unspecified: Secondary | ICD-10-CM

## 2024-09-30 NOTE — Progress Notes (Signed)
   PRENATAL VISIT NOTE  Subjective:  Anna Guzman is a 33 y.o. [id number] at [redacted]w[redacted]d being seen today for ongoing prenatal care.  She is currently monitored for the following issues for this low-risk pregnancy and has Amenorrhea, secondary; Family history of MTHFR deficiency; Hypothyroidism; Infertility, female; Asthma; History of severe pre-eclampsia; Encounter for supervision of normal pregnancy, antepartum; and History of gestational diabetes in prior pregnancy, currently pregnant on their problem list.  Patient reports no complaints.  Contractions: Not present.  .  Movement: Present. Denies leaking of fluid.   The following portions of the patient's history were reviewed and updated as appropriate: allergies, current medications, past family history, past medical history, past social history, past surgical history and problem list.   Objective:    Vitals:   09/30/24 1046  BP: 118/78  Pulse: 98  Weight: 131 lb 6.4 oz (59.6 kg)    Fetal Status:      Movement: Present    General: Alert, oriented and cooperative. Patient is in no acute distress.  Skin: Skin is warm and dry. No rash noted.   Cardiovascular: Normal heart rate noted  Respiratory: Normal respiratory effort, no problems with respiration noted  Abdomen: Soft, gravid, appropriate for gestational age.  Pain/Pressure: Absent     Pelvic: Cervical exam deferred        Extremities: Normal range of motion.  Edema: None  Mental Status: Normal mood and affect. Normal behavior. Normal judgment and thought content.  Fetal surveillance: US  19+5 wks,breech,left lateral placenta gr 0,normal ovaries,FHR 134 bpm,CX 3.2 cm,SVP of fluid 6 cm,normal ovaries,EFW 285 g 24%,anatomy complete,no obvious abnormalcies   Assessment and Plan:  Pregnancy: [id number] at [redacted]w[redacted]d 1. Supervision of other normal pregnancy, antepartum (Primary) BP and FHR normal Doing well, feeling regular movement    2. [redacted] weeks gestation of pregnancy Normal anatomy  today Plan [facility], peds Planning to breast feed  3. Hypothyroidism, unspecified type Labs checked early second trimester Recheck labs next visit  Continue synthroid    4. History of severe pre-eclampsia Normotensive, continue ASA   Preterm labor symptoms and general obstetric precautions including but not limited to vaginal bleeding, contractions, leaking of fluid and fetal movement were reviewed in detail with the patient. Please refer to After Visit Summary for other counseling recommendations.   Return in about 4 weeks (around 10/28/2024) for OB VISIT (MD or APP).  Future Appointments  Date Time Provider Department Center  10/28/2024  8:50 AM Ozan, Jennifer, DO [facility]    Nidia Daring, FNP

## 2024-09-30 NOTE — Progress Notes (Addendum)
 US  19+5 wks,breech,left lateral placenta gr 0,normal ovaries,FHR 134 bpm,CX 3.2 cm,SVP of fluid 6 cm,normal ovaries,EFW 285 g 24%,anatomy complete,no obvious abnormalcies

## 2024-10-28 ENCOUNTER — Ambulatory Visit: Admitting: Obstetrics & Gynecology

## 2024-10-28 ENCOUNTER — Encounter: Payer: Self-pay | Admitting: Obstetrics & Gynecology

## 2024-10-28 VITALS — BP 121/70 | HR 106 | Wt 139.2 lb

## 2024-10-28 DIAGNOSIS — Z3482 Encounter for supervision of other normal pregnancy, second trimester: Secondary | ICD-10-CM

## 2024-10-28 DIAGNOSIS — K219 Gastro-esophageal reflux disease without esophagitis: Secondary | ICD-10-CM

## 2024-10-28 DIAGNOSIS — Z3A23 23 weeks gestation of pregnancy: Secondary | ICD-10-CM | POA: Diagnosis not present

## 2024-10-28 MED ORDER — FAMOTIDINE 20 MG PO TABS: 20.0000 mg | ORAL_TABLET | Freq: Two times a day (BID) | ORAL | 11 refills | Status: AC

## 2024-10-28 NOTE — Progress Notes (Signed)
 LOW-RISK PREGNANCY VISIT Patient name: Anna Guzman MRN [id number]  Date of birth: 28-Feb-1991 Chief Complaint:   Routine Prenatal Visit  History of Present Illness:   Anna Guzman is a 33 y.o. [id number] female at [redacted]w[redacted]d with an Estimated Date of Delivery: 02/19/25 being seen today for ongoing management of a low-risk pregnancy.      08/11/2024   11:23 AM 08/01/2021   10:04 AM 03/25/2021    3:31 PM 10/06/2018    9:35 AM 12/28/2017    8:52 AM  Depression screen PHQ 2/9  Decreased Interest 0 0 0 0 0  Down, Depressed, Hopeless 0 0 0 0 0  PHQ - 2 Score 0 0 0 0 0  Altered sleeping 0 0 0 0   Tired, decreased energy 0 1 1 0   Change in appetite 0 0 0 0   Feeling bad or failure about yourself  0 0 0 0   Trouble concentrating 0 0 0 0   Moving slowly or fidgety/restless 0 0 0 0   Suicidal thoughts 0 0 0 0   PHQ-9 Score 0  1  1  0       Data saved with a previous flowsheet row definition    Today she reports heartburn. Not taking any medication.  Contractions: Not present. Vag. Bleeding: None.  Movement: Present. denies leaking of fluid. Review of Systems:   Pertinent items are noted in HPI Denies abnormal vaginal discharge w/ itching/odor/irritation, headaches, visual changes, shortness of breath, chest pain, abdominal pain, severe nausea/vomiting, or problems with urination or bowel movements unless otherwise stated above. Pertinent History Reviewed:  Reviewed past medical,surgical, social, obstetrical and family history.  Reviewed problem list, medications and allergies.  Physical Assessment:   Vitals:   10/28/24 0849  BP: 121/70  Pulse: (!) 106  Weight: 139 lb 3.2 oz (63.1 kg)  Body mass index is 23.16 kg/m.        Physical Examination:   General appearance: Well appearing, and in no distress  Mental status: Alert, oriented to person, place, and time  Skin: Warm & dry  Respiratory: Normal respiratory effort, no distress  Abdomen: Soft, gravid, nontender  Pelvic: Cervical  exam deferred         Extremities:    Psych:  mood and affect appropriate  Fetal Status: Fetal Heart Rate (bpm): 150 Fundal Height: 23 cm Movement: Present    Chaperone: n/a    No results found for this or any previous visit (from the past 24 hours).   Assessment & Plan:  1) Low-risk pregnancy [id number] at [redacted]w[redacted]d with an Estimated Date of Delivery: 02/19/25   -GERD- pepcid  sent in   Meds:  Meds ordered this encounter  Medications   famotidine  (PEPCID ) 20 MG tablet    Sig: Take 1 tablet (20 mg total) by mouth 2 (two) times daily.    Dispense:  60 tablet    Refill:  11   Labs/procedures today: none  Plan:  Continue routine obstetrical care, []  PN2 next visit  Next visit: prefers in person    Reviewed: Preterm labor symptoms and general obstetric precautions including but not limited to vaginal bleeding, contractions, leaking of fluid and fetal movement were reviewed in detail with the patient.  All questions were answered. Pt has home bp cuff. Check bp weekly, let us  know if >140/90.   Follow-up: Return in about 4 weeks (around 11/25/2024) for LROB and PN2 visit.  No orders of the defined types were placed in  this encounter.   Tonica Brasington, DO Attending Obstetrician & Gynecologist, [facility] for [employer], [facility]

## 2024-11-12 ENCOUNTER — Other Ambulatory Visit: Payer: Self-pay | Admitting: Advanced Practice Midwife

## 2024-11-25 ENCOUNTER — Encounter: Payer: Self-pay | Admitting: Obstetrics & Gynecology

## 2024-11-25 ENCOUNTER — Other Ambulatory Visit

## 2024-11-25 ENCOUNTER — Ambulatory Visit: Payer: Self-pay | Admitting: Obstetrics & Gynecology

## 2024-11-25 VITALS — Wt 140.2 lb

## 2024-11-25 DIAGNOSIS — Z3482 Encounter for supervision of other normal pregnancy, second trimester: Secondary | ICD-10-CM

## 2024-11-25 DIAGNOSIS — Z3A27 27 weeks gestation of pregnancy: Secondary | ICD-10-CM

## 2024-11-25 DIAGNOSIS — Z1332 Encounter for screening for maternal depression: Secondary | ICD-10-CM | POA: Diagnosis not present

## 2024-11-25 DIAGNOSIS — Z3480 Encounter for supervision of other normal pregnancy, unspecified trimester: Secondary | ICD-10-CM

## 2024-11-25 DIAGNOSIS — Z131 Encounter for screening for diabetes mellitus: Secondary | ICD-10-CM

## 2024-11-25 NOTE — Progress Notes (Signed)
" ° °  LOW-RISK PREGNANCY VISIT Patient name: Anna Guzman MRN [id number]  Date of birth: [birth year] Chief Complaint:   Routine Prenatal Visit  History of Present Illness:   Anna Guzman is a 33 y.o. [id number] female at [redacted]w[redacted]d with an Estimated Date of Delivery: 02/19/25 being seen today for ongoing management of a low-risk pregnancy.       11/25/2024    9:01 AM 08/11/2024   11:23 AM 08/01/2021   10:04 AM 03/25/2021    3:31 PM 10/06/2018    9:35 AM  Depression screen PHQ 2/9  Decreased Interest 0 0 0 0 0  Down, Depressed, Hopeless 0 0 0 0 0  PHQ - 2 Score 0 0 0 0 0  Altered sleeping 0 0 0 0 0  Tired, decreased energy 0 0 1 1 0  Change in appetite 0 0 0 0 0  Feeling bad or failure about yourself  0 0 0 0 0  Trouble concentrating 0 0 0 0 0  Moving slowly or fidgety/restless 0 0 0 0 0  Suicidal thoughts 0 0 0 0 0  PHQ-9 Score 0 0  1  1  0      Data saved with a previous flowsheet row definition    Today she reports no complaints. Contractions: Not present. Vag. Bleeding: None.  Movement: Present. denies leaking of fluid. Review of Systems:   Pertinent items are noted in HPI Denies abnormal vaginal discharge w/ itching/odor/irritation, headaches, visual changes, shortness of breath, chest pain, abdominal pain, severe nausea/vomiting, or problems with urination or bowel movements unless otherwise stated above. Pertinent History Reviewed:  Reviewed past medical,surgical, social, obstetrical and family history.  Reviewed problem list, medications and allergies.  Physical Assessment:   Vitals:   11/25/24 0901  Weight: 140 lb 3.2 oz (63.6 kg)  Body mass index is 23.33 kg/m.        Physical Examination:   General appearance: Well appearing, and in no distress  Mental status: Alert, oriented to person, place, and time  Skin: Warm & dry  Respiratory: Normal respiratory effort, no distress  Abdomen: Soft, gravid, nontender  Pelvic: Cervical exam deferred         Extremities: Edema:  None  Psych:  mood and affect appropriate  Fetal Status: Fetal Heart Rate (bpm): 155 Fundal Height: 26 cm Movement: Present    Chaperone: n/a    No results found for this or any previous visit (from the past 24 hours).   Assessment & Plan:  1) Low-risk pregnancy G3P0111 at [redacted]w[redacted]d with an Estimated Date of Delivery: 02/19/25   No acute concerns -Tdap next visit    Meds: No orders of the defined types were placed in this encounter.  Labs/procedures today: PN2 today  Plan:  Continue routine obstetrical care  Next visit: prefers in person    Reviewed: Preterm labor symptoms and general obstetric precautions including but not limited to vaginal bleeding, contractions, leaking of fluid and fetal movement were reviewed in detail with the patient.  All questions were answered. Pt has home bp cuff. Check bp weekly, let us  know if >140/90.   Follow-up: Return for 2-3wks LROB visit.  No orders of the defined types were placed in this encounter.   Mozes Sagar, DO Attending Obstetrician & Gynecologist, [facility] for [facility], [facility]    "

## 2024-11-26 LAB — ANTIBODY SCREEN: Antibody Screen: NEGATIVE

## 2024-11-26 LAB — CBC
Hematocrit: 33.3 % — ABNORMAL LOW (ref 34.0–46.6)
Hemoglobin: 10.8 g/dL — ABNORMAL LOW (ref 11.1–15.9)
MCH: 29.7 pg (ref 26.6–33.0)
MCHC: 32.4 g/dL (ref 31.5–35.7)
MCV: 92 fL (ref 79–97)
Platelets: 293 x10E3/uL (ref 150–450)
RBC: 3.64 x10E6/uL — ABNORMAL LOW (ref 3.77–5.28)
RDW: 11.8 % (ref 11.7–15.4)
WBC: 12.4 x10E3/uL — ABNORMAL HIGH (ref 3.4–10.8)

## 2024-11-26 LAB — GLUCOSE TOLERANCE, 2 HOURS W/ 1HR
Glucose, 1 hour: 156 mg/dL (ref 70–179)
Glucose, 2 hour: 122 mg/dL (ref 70–152)
Glucose, Fasting: 70 mg/dL (ref 70–91)

## 2024-11-26 LAB — SYPHILIS: RPR W/REFLEX TO RPR TITER AND TREPONEMAL ANTIBODIES, TRADITIONAL SCREENING AND DIAGNOSIS ALGORITHM: RPR Ser Ql: NONREACTIVE

## 2024-11-26 LAB — HIV ANTIBODY (ROUTINE TESTING W REFLEX): HIV Screen 4th Generation wRfx: NONREACTIVE

## 2024-11-29 ENCOUNTER — Ambulatory Visit: Payer: Self-pay | Admitting: Obstetrics & Gynecology

## 2024-11-29 ENCOUNTER — Encounter: Payer: Self-pay | Admitting: Obstetrics & Gynecology

## 2024-11-29 DIAGNOSIS — O99019 Anemia complicating pregnancy, unspecified trimester: Secondary | ICD-10-CM | POA: Insufficient documentation

## 2024-12-16 ENCOUNTER — Ambulatory Visit (INDEPENDENT_AMBULATORY_CARE_PROVIDER_SITE_OTHER): Admitting: Obstetrics and Gynecology

## 2024-12-16 ENCOUNTER — Encounter: Payer: Self-pay | Admitting: Obstetrics and Gynecology

## 2024-12-16 VITALS — BP 115/73 | HR 102 | Wt 145.0 lb

## 2024-12-16 DIAGNOSIS — Z23 Encounter for immunization: Secondary | ICD-10-CM

## 2024-12-16 DIAGNOSIS — Z8759 Personal history of other complications of pregnancy, childbirth and the puerperium: Secondary | ICD-10-CM

## 2024-12-16 DIAGNOSIS — E039 Hypothyroidism, unspecified: Secondary | ICD-10-CM

## 2024-12-16 DIAGNOSIS — Z3A30 30 weeks gestation of pregnancy: Secondary | ICD-10-CM | POA: Diagnosis not present

## 2024-12-16 DIAGNOSIS — O99283 Endocrine, nutritional and metabolic diseases complicating pregnancy, third trimester: Secondary | ICD-10-CM | POA: Diagnosis not present

## 2024-12-16 DIAGNOSIS — Z3480 Encounter for supervision of other normal pregnancy, unspecified trimester: Secondary | ICD-10-CM

## 2024-12-16 NOTE — Progress Notes (Signed)
" ° °  PRENATAL VISIT NOTE  Subjective:  Anna Guzman is a 34 y.o. [phone] at [redacted]w[redacted]d being seen today for ongoing prenatal care.  She is currently monitored for the following issues for this low-risk pregnancy and has Amenorrhea, secondary; Family history of MTHFR deficiency; Hypothyroidism; Infertility, female; Asthma; History of severe pre-eclampsia; Encounter for supervision of normal pregnancy, antepartum; History of gestational diabetes in prior pregnancy, currently pregnant; and Anemia affecting pregnancy on their problem list.  Patient reports fatigue Contractions: Irritability.  .  Movement: Present. Denies leaking of fluid.   The following portions of the patient's history were reviewed and updated as appropriate: allergies, current medications, past family history, past medical history, past social history, past surgical history and problem list.   Objective:   Vitals:   12/16/24 1056  BP: 115/73  Pulse: (!) 102  Weight: 145 lb (65.8 kg)    Fetal Status:  Fetal Heart Rate (bpm): 139 Fundal Height: 30 cm Movement: Present    General: Alert, oriented and cooperative. Patient is in no acute distress.  Skin: Skin is warm and dry. No rash noted.   Cardiovascular: Normal heart rate noted  Respiratory: Normal respiratory effort, no problems with respiration noted  Abdomen: Soft, gravid, appropriate for gestational age.  Pain/Pressure: Absent     Pelvic: Cervical exam deferred        Extremities: Normal range of motion.  Edema: Trace  Mental Status: Normal mood and affect. Normal behavior. Normal judgment and thought content.   Assessment and Plan:  Pregnancy: [id number] at [redacted]w[redacted]d 1. Supervision of other normal pregnancy, antepartum (Primary) Bp and FHR normal Feeling vigorous movement  2. [redacted] weeks gestation of pregnancy Tdap today   3. History of severe pre-eclampsia Normotensive, continue monitoring at home   4. Hypothyroidism, unspecified type On Levothyroxine     Preterm  labor symptoms and general obstetric precautions including but not limited to vaginal bleeding, contractions, leaking of fluid and fetal movement were reviewed in detail with the patient. Please refer to After Visit Summary for other counseling recommendations.   Return in about 2 weeks (around 12/30/2024) for OB VISIT (MD or APP).  Nidia Daring, FNP "

## 2024-12-16 NOTE — Addendum Note (Signed)
 Addended by: SANNA GONG A on: 12/16/2024 11:42 AM   Modules accepted: Orders

## 2024-12-20 ENCOUNTER — Other Ambulatory Visit (HOSPITAL_COMMUNITY)
Admission: RE | Admit: 2024-12-20 | Discharge: 2024-12-20 | Disposition: A | Discharge status: Home / Self Care | Source: Ambulatory Visit | Attending: Obstetrics & Gynecology | Admitting: Obstetrics & Gynecology

## 2024-12-20 ENCOUNTER — Encounter: Payer: Self-pay | Admitting: Obstetrics & Gynecology

## 2024-12-20 ENCOUNTER — Ambulatory Visit: Admitting: Obstetrics & Gynecology

## 2024-12-20 VITALS — BP 127/78 | HR 94 | Wt 146.8 lb

## 2024-12-20 DIAGNOSIS — O99891 Other specified diseases and conditions complicating pregnancy: Secondary | ICD-10-CM

## 2024-12-20 DIAGNOSIS — O26893 Other specified pregnancy related conditions, third trimester: Secondary | ICD-10-CM | POA: Diagnosis present

## 2024-12-20 DIAGNOSIS — N898 Other specified noninflammatory disorders of vagina: Secondary | ICD-10-CM | POA: Diagnosis not present

## 2024-12-20 DIAGNOSIS — O99013 Anemia complicating pregnancy, third trimester: Secondary | ICD-10-CM | POA: Diagnosis not present

## 2024-12-20 DIAGNOSIS — D649 Anemia, unspecified: Secondary | ICD-10-CM

## 2024-12-20 DIAGNOSIS — Z3A31 31 weeks gestation of pregnancy: Secondary | ICD-10-CM | POA: Insufficient documentation

## 2024-12-20 DIAGNOSIS — Z3403 Encounter for supervision of normal first pregnancy, third trimester: Secondary | ICD-10-CM

## 2024-12-20 NOTE — Progress Notes (Signed)
 "  LOW-RISK PREGNANCY VISIT Patient name: Anna Guzman MRN [id number]  Date of birth: 1991-01-14 Chief Complaint:   Routine Prenatal Visit  History of Present Illness:   Anna Guzman is a 34 y.o. H6E9888 female at 100w2d with an Estimated Date of Delivery: 02/19/25 being seen today for ongoing management of a low-risk pregnancy.   Notes increased vaginal discharge- thin/watery for the past several days.  On occasion may have a Braxton-Hicks, but nothing regular.  Denies vaginal bleeding.  Notes good fetal movement     11/25/2024    9:01 AM 08/11/2024   11:23 AM 08/01/2021   10:04 AM 03/25/2021    3:31 PM 10/06/2018    9:35 AM  Depression screen PHQ 2/9  Decreased Interest 0 0 0 0 0  Down, Depressed, Hopeless 0 0 0 0 0  PHQ - 2 Score 0 0 0 0 0  Altered sleeping 0 0 0 0 0  Tired, decreased energy 0 0 1 1 0  Change in appetite 0 0 0 0 0  Feeling bad or failure about yourself  0 0 0 0 0  Trouble concentrating 0 0 0 0 0  Moving slowly or fidgety/restless 0 0 0 0 0  Suicidal thoughts 0 0 0 0 0  PHQ-9 Score 0 0  1  1  0      Data saved with a previous flowsheet row definition     Review of Systems:   Pertinent items are noted in HPI Denies headaches, visual changes, shortness of breath, chest pain, abdominal pain, severe nausea/vomiting, or problems with urination or bowel movements unless otherwise stated above. Pertinent History Reviewed:  Reviewed past medical,surgical, social, obstetrical and family history.  Reviewed problem list, medications and allergies.  Physical Assessment:   Vitals:   12/20/24 1052  BP: 127/78  Pulse: 94  Weight: 146 lb 12.8 oz (66.6 kg)  Body mass index is 24.43 kg/m.        Physical Examination:   General appearance: Well appearing, and in no distress  Mental status: Alert, oriented to person, place, and time  Skin: Warm & dry  Respiratory: Normal respiratory effort, no distress  Abdomen: Soft, gravid, nontender  Pelvic: Cervical exam  deferred sterile speculum exam completed cervix visualized appears closed.  Physiologic white discharge noted no pooling, negative ferning Dilation: Closed      Extremities:  no edema  Psych:  mood and affect appropriate  Fetal Status: Fetal Heart Rate (bpm): 140 Fundal Height: 31 cm Movement: Present    Chaperone: Dimitry Shitarev, PGY2    No results found for this or any previous visit (from the past 24 hours).   Assessment & Plan:  1) Low-risk pregnancy G3P0111 at [redacted]w[redacted]d with an Estimated Date of Delivery: 02/19/25   -Ruled out for rupture, suspected illogic discharge - Vaginitis panel obtained, further management pending results  Anemia- on iron   Meds: No orders of the defined types were placed in this encounter.  Labs/procedures today: vaginitis panel  Plan:  Continue routine obstetrical care  Next visit: prefers in person    Reviewed: Preterm labor symptoms and general obstetric precautions including but not limited to vaginal bleeding, contractions, leaking of fluid and fetal movement were reviewed in detail with the patient.  All questions were answered.   Follow-up: Return for as scheduled.  No orders of the defined types were placed in this encounter.   Valon Glasscock, DO Attending Obstetrician & Gynecologist, [facility] for [employer], [facility]    "

## 2024-12-21 ENCOUNTER — Ambulatory Visit: Payer: Self-pay | Admitting: Obstetrics & Gynecology

## 2024-12-21 LAB — CERVICOVAGINAL ANCILLARY ONLY
Bacterial Vaginitis (gardnerella): NEGATIVE
Candida Glabrata: NEGATIVE
Candida Vaginitis: NEGATIVE
Comment: NEGATIVE
Comment: NEGATIVE
Comment: NEGATIVE
Comment: NEGATIVE
Trichomonas: NEGATIVE

## 2024-12-23 ENCOUNTER — Encounter: Payer: Self-pay | Admitting: Obstetrics & Gynecology

## 2024-12-23 ENCOUNTER — Other Ambulatory Visit: Payer: Self-pay | Admitting: Obstetrics & Gynecology

## 2024-12-23 MED ORDER — LEVOTHYROXINE SODIUM 50 MCG PO TABS: ORAL_TABLET | ORAL | 3 refills | Status: AC

## 2024-12-23 MED ORDER — LEVOTHYROXINE SODIUM 50 MCG PO TABS: ORAL_TABLET | ORAL | 3 refills | Status: DC

## 2024-12-23 NOTE — Addendum Note (Signed)
 Addended by: JAYNE VONN DEL on: 12/23/2024 09:50 PM   Modules accepted: Orders

## 2024-12-26 ENCOUNTER — Telehealth: Payer: Self-pay | Admitting: *Deleted

## 2024-12-26 NOTE — Telephone Encounter (Signed)
 [facility] in [place] called. Pt's insurance will not cover Levothyroxine  50 mcg take 2 tabs on Tuesday and Saturday and 1 tab all other days of the week. I gave a verbal for Levothyroxine  100 mcg on Tuesday and Saturday # 30 with 3 refills and Levothyroxine  50 mcg all other days # 90 with 3 refills. Pt aware she will be getting 2 prescriptions. JSY

## 2024-12-29 ENCOUNTER — Ambulatory Visit: Admitting: Women's Health

## 2024-12-29 ENCOUNTER — Encounter: Payer: Self-pay | Admitting: Women's Health

## 2024-12-29 VITALS — BP 122/77 | HR 109 | Wt 147.0 lb

## 2024-12-29 DIAGNOSIS — Z8759 Personal history of other complications of pregnancy, childbirth and the puerperium: Secondary | ICD-10-CM

## 2024-12-29 DIAGNOSIS — E039 Hypothyroidism, unspecified: Secondary | ICD-10-CM | POA: Diagnosis not present

## 2024-12-29 DIAGNOSIS — O99283 Endocrine, nutritional and metabolic diseases complicating pregnancy, third trimester: Secondary | ICD-10-CM

## 2024-12-29 DIAGNOSIS — Z2911 Encounter for prophylactic immunotherapy for respiratory syncytial virus (RSV): Secondary | ICD-10-CM

## 2024-12-29 DIAGNOSIS — Z3483 Encounter for supervision of other normal pregnancy, third trimester: Secondary | ICD-10-CM

## 2024-12-29 DIAGNOSIS — Z3480 Encounter for supervision of other normal pregnancy, unspecified trimester: Secondary | ICD-10-CM

## 2024-12-29 DIAGNOSIS — Z3A32 32 weeks gestation of pregnancy: Secondary | ICD-10-CM

## 2024-12-29 NOTE — Progress Notes (Signed)
 "   LOW-RISK PREGNANCY VISIT Patient name: Anna Guzman MRN [id number]  Date of birth: 06-Aug-1991 Chief Complaint:   Routine Prenatal Visit  History of Present Illness:   Anna Guzman is a 34 y.o. H6E9888 female at [redacted]w[redacted]d with an Estimated Date of Delivery: 02/19/25 being seen today for ongoing management of a low-risk pregnancy.   Today she reports varicose veins, don't hurt/bother her, some contractions. Contractions: Not present. Vag. Bleeding: None.  Movement: Present. denies leaking of fluid.     11/25/2024    9:01 AM 08/11/2024   11:23 AM 08/01/2021   10:04 AM 03/25/2021    3:31 PM 10/06/2018    9:35 AM  Depression screen PHQ 2/9  Decreased Interest 0 0 0 0 0  Down, Depressed, Hopeless 0 0 0 0 0  PHQ - 2 Score 0 0 0 0 0  Altered sleeping 0 0 0 0 0  Tired, decreased energy 0 0 1 1 0  Change in appetite 0 0 0 0 0  Feeling bad or failure about yourself  0 0 0 0 0  Trouble concentrating 0 0 0 0 0  Moving slowly or fidgety/restless 0 0 0 0 0  Suicidal thoughts 0 0 0 0 0  PHQ-9 Score 0 0  1  1  0      Data saved with a previous flowsheet row definition        11/25/2024    9:02 AM 08/11/2024   11:24 AM 08/01/2021   10:05 AM 03/25/2021    3:31 PM  GAD 7 : Generalized Anxiety Score  Nervous, Anxious, on Edge 0  0  0  0   Control/stop worrying 0  0  0  0   Worry too much - different things 0  0  0  0   Trouble relaxing 0  0  0  0   Restless 0  0  0  0   Easily annoyed or irritable 0  0  0  0   Afraid - awful might happen 0  0  0  0   Total GAD 7 Score 0 0 0 0     Data saved with a previous flowsheet row definition      Review of Systems:   Pertinent items are noted in HPI Denies abnormal vaginal discharge w/ itching/odor/irritation, headaches, visual changes, shortness of breath, chest pain, abdominal pain, severe nausea/vomiting, or problems with urination or bowel movements unless otherwise stated above. Pertinent History Reviewed:  Reviewed past medical,surgical,  social, obstetrical and family history.  Reviewed problem list, medications and allergies. Physical Assessment:   Vitals:   12/29/24 0848  BP: 122/77  Pulse: (!) 109  Weight: 147 lb (66.7 kg)  Body mass index is 24.46 kg/m.        Physical Examination:   General appearance: Well appearing, and in no distress  Mental status: Alert, oriented to person, place, and time  Skin: Warm & dry  Cardiovascular: Normal heart rate noted  Respiratory: Normal respiratory effort, no distress  Abdomen: Soft, gravid, nontender  Pelvic: Cervical exam deferred         Extremities:    Fetal Status: Fetal Heart Rate (bpm): 145 Fundal Height: 32 cm Movement: Present    Chaperone: N/A No results found for this or any previous visit (from the past 24 hours).  Assessment & Plan:  1) Low-risk pregnancy G3P0111 at [redacted]w[redacted]d with an Estimated Date of Delivery: 02/19/25   2) H/O severe pre-e, ASA,  if home bp's >140/90 or pre-e sx let us  know  3) Hypothyroidism> on synthroid  , check TSH today  4) Varicose veins> continue ASA, can get compression stockings   Meds: No orders of the defined types were placed in this encounter.  Labs/procedures today: RSV vaccine  Plan:  Continue routine obstetrical care  Next visit: prefers in person    Reviewed: Preterm labor symptoms and general obstetric precautions including but not limited to vaginal bleeding, contractions, leaking of fluid and fetal movement were reviewed in detail with the patient.  All questions were answered. Does have home bp cuff. Office bp cuff given: not applicable. Check bp q 1-2d, let us  know if consistently >140 and/or >90.  Follow-up: Return for q2wks til 36w then weekly LROB .  Future Appointments  Date Time Provider Department Center  01/13/2025  9:30 AM Ozan, Jennifer, DO [facility]  01/27/2025  9:50 AM Delores Nidia CROME, FNP [facility]    Orders Placed This Encounter  Procedures   Respiratory syncytial virus vaccine,  preF, subunit, bivalent,(Abrysvo)   TSH   Suzen JONELLE Fetters CNM, [facility] 12/29/2024 9:46 AM  "

## 2024-12-29 NOTE — Patient Instructions (Signed)
 Anna Guzman, thank you for choosing our office today! We appreciate the opportunity to meet your healthcare needs. You may receive a short survey by mail, e-mail, or through [employer]. If you are happy with your care we would appreciate if you could take just a few minutes to complete the survey questions. We read all of your comments and take your feedback very seriously. Thank you again for choosing our office.  Center for [employer] Team at [facility]  [facility] at [facility] ([address] [place], [place] [address]) Entrance C, located off of [address] Free 24/7 valet parking   CLASSES: Go to [email or web address] to register for classes (childbirth, breastfeeding, waterbirth, infant CPR, daddy bootcamp, etc.)  Call the office [phone]) or go to [facility] if: You begin to have strong, frequent contractions Your water breaks.  Sometimes it is a big gush of fluid, sometimes it is just a trickle that keeps getting your panties wet or running down your legs You have vaginal bleeding.  It is normal to have a small amount of spotting if your cervix was checked.  You don't feel your baby moving like normal.  If you don't, get you something to eat and drink and lay down and focus on feeling your baby move.   If your baby is still not moving like normal, you should call the office or go to [facility].  Call the office [phone]) or go to [facility] hospital for these signs of pre-eclampsia: Severe headache that does not go away with Tylenol  Visual changes- seeing spots, double, blurred vision Pain under your right breast or upper abdomen that does not go away with Tums or heartburn medicine Nausea and/or vomiting Severe swelling in your hands, feet, and face   Tdap Vaccine It is recommended that you get the Tdap vaccine during the third trimester of EACH pregnancy to help protect your baby from getting pertussis (whooping cough) 27-36 weeks is the BEST time to do  this so that you can pass the protection on to your baby. During pregnancy is better than after pregnancy, but if you are unable to get it during pregnancy it will be offered at the hospital.  You can get this vaccine with us , at the health department, your family doctor, or some local pharmacies Everyone who will be around your baby should also be up-to-date on their vaccines before the baby comes. Adults (who are not pregnant) only need 1 dose of Tdap during adulthood.   [facility] Pediatricians/Family Doctors [facility]): [address] Luba BROCKS, [phone]           [facility]: [address], [phone]                [facility]): [address], [phone] (call to ask if accepting patients) [facility] Department: [address], [place], [phone]    [facility] Pediatricians/Family Doctors [facility]): [phone], [phone] [facility]: [address] [place], [phone] [facility]: [address], [phone]  [facility] Doctors  [facility]): [phone] [facility]: [address] [phone]   [facility] Doctors [facility]: [address], [phone]  [facility] Family Doctors  [facility]: [phone], [phone]  Home Blood Pressure Monitoring for Patients   Your provider has recommended that you check your  blood pressure (BP) at least once a week at home. If you do not have a blood pressure cuff at home, one will be provided for you. Contact your provider if you have not received your monitor within 1 week.   Helpful Tips for Accurate Home Blood Pressure Checks  Don't smoke, exercise, or drink caffeine 30 minutes before checking your BP Use the restroom before checking your BP (a full bladder can raise your  pressure) Relax in a comfortable upright chair Feet on the ground Left arm resting comfortably on a flat surface at the level of your heart Legs uncrossed Back supported Sit quietly and don't talk Place the cuff on your bare arm Adjust snuggly, so that only two fingertips can fit between your skin and the top of the cuff Check 2 readings separated by at least one minute Keep a log of your BP readings For a visual, please reference this diagram: http://ccnc.care/bpdiagram  Provider Name: [facility]     Phone: [phone]  Zone 1: ALL CLEAR  Continue to monitor your symptoms:  BP reading is less than 140 (top number) or less than 90 (bottom number)  No right upper stomach pain No headaches or seeing spots No feeling nauseated or throwing up No swelling in face and hands  Zone 2: CAUTION Call your doctor's office for any of the following:  BP reading is greater than 140 (top number) or greater than 90 (bottom number)  Stomach pain under your ribs in the middle or right side Headaches or seeing spots Feeling nauseated or throwing up Swelling in face and hands  Zone 3: EMERGENCY  Seek immediate medical care if you have any of the following:  BP reading is greater than160 (top number) or greater than 110 (bottom number) Severe headaches not improving with Tylenol  Serious difficulty catching your breath Any worsening symptoms from Zone 2  Preterm Labor and Birth Information  The normal length of a pregnancy is 39-41 weeks. Preterm labor is when labor starts before 37 completed weeks of pregnancy. What are the risk factors for preterm labor? Preterm labor is more likely to occur in women who: Have certain infections during pregnancy such as a bladder infection, sexually transmitted infection, or infection inside the uterus (chorioamnionitis). Have a shorter-than-normal cervix. Have gone into preterm labor before. Have had surgery on their cervix. Are younger than age 69  or older than age 69. Are African American. Are pregnant with twins or multiple babies (multiple gestation). Take street drugs or smoke while pregnant. Do not gain enough weight while pregnant. Became pregnant shortly after having been pregnant. What are the symptoms of preterm labor? Symptoms of preterm labor include: Cramps similar to those that can happen during a menstrual period. The cramps may happen with diarrhea. Pain in the abdomen or lower back. Regular uterine contractions that may feel like tightening of the abdomen. A feeling of increased pressure in the pelvis. Increased watery or bloody mucus discharge from the vagina. Water breaking (ruptured amniotic sac). Why is it important to recognize signs of preterm labor? It is important to recognize signs of preterm labor because babies who are born prematurely may not be fully developed. This can put them at an increased risk for: Long-term (chronic) heart and lung problems. Difficulty immediately after birth with regulating body systems, including blood sugar, body temperature, heart rate, and breathing rate. Bleeding in the brain. Cerebral palsy. Learning difficulties. Death. These risks are highest for babies who are born before 34 weeks  of pregnancy. How is preterm labor treated? Treatment depends on the length of your pregnancy, your condition, and the health of your baby. It may involve: Having a stitch (suture) placed in your cervix to prevent your cervix from opening too early (cerclage). Taking or being given medicines, such as: Hormone medicines. These may be given early in pregnancy to help support the pregnancy. Medicine to stop contractions. Medicines to help mature the babys lungs. These may be prescribed if the risk of delivery is high. Medicines to prevent your baby from developing cerebral palsy. If the labor happens before 34 weeks of pregnancy, you may need to stay in the hospital. What should I do if I  think I am in preterm labor? If you think that you are going into preterm labor, call your health care provider right away. How can I prevent preterm labor in future pregnancies? To increase your chance of having a full-term pregnancy: Do not use any tobacco products, such as cigarettes, chewing tobacco, and e-cigarettes. If you need help quitting, ask your health care provider. Do not use street drugs or medicines that have not been prescribed to you during your pregnancy. Talk with your health care provider before taking any herbal supplements, even if you have been taking them regularly. Make sure you gain a healthy amount of weight during your pregnancy. Watch for infection. If you think that you might have an infection, get it checked right away. Make sure to tell your health care provider if you have gone into preterm labor before. This information is not intended to replace advice given to you by your health care provider. Make sure you discuss any questions you have with your health care provider. Document Revised: 03/18/2019 Document Reviewed: 04/16/2016 Elsevier Patient Education  2020 [employer].

## 2024-12-30 LAB — TSH: TSH: 0.469 u[IU]/mL (ref 0.450–4.500)

## 2025-01-03 ENCOUNTER — Ambulatory Visit: Payer: Self-pay | Admitting: Women's Health

## 2025-01-04 ENCOUNTER — Encounter: Payer: Self-pay | Admitting: Obstetrics & Gynecology

## 2025-01-12 ENCOUNTER — Encounter: Payer: Self-pay | Admitting: Adult Health

## 2025-01-13 ENCOUNTER — Ambulatory Visit: Admitting: Obstetrics & Gynecology

## 2025-01-13 VITALS — BP 139/80 | HR 108 | Wt 152.6 lb

## 2025-01-13 DIAGNOSIS — Z3483 Encounter for supervision of other normal pregnancy, third trimester: Secondary | ICD-10-CM

## 2025-01-13 DIAGNOSIS — Z3A34 34 weeks gestation of pregnancy: Secondary | ICD-10-CM

## 2025-01-13 DIAGNOSIS — E039 Hypothyroidism, unspecified: Secondary | ICD-10-CM

## 2025-01-13 NOTE — Progress Notes (Signed)
 "  HIGH-RISK PREGNANCY VISIT Patient name: Anna Guzman MRN [id number]  Date of birth: 03-10-1991 Chief Complaint:   Routine Prenatal Visit  History of Present Illness:   Anna Guzman is a 34 y.o. G3P0111 female at [redacted]w[redacted]d with an Estimated Date of Delivery: 02/19/25 being seen today for ongoing management of a high-risk pregnancy complicated by:  -Hypothyroidism-taking Synthroid  50 mcg daily Testing last completed January  Today she reports occasional contractions.   Contractions: Irritability. Vag. Bleeding: None.  Movement: Present. denies leaking of fluid.      11/25/2024    9:01 AM 08/11/2024   11:23 AM 08/01/2021   10:04 AM 03/25/2021    3:31 PM 10/06/2018    9:35 AM  Depression screen PHQ 2/9  Decreased Interest 0 0 0 0 0  Down, Depressed, Hopeless 0 0 0 0 0  PHQ - 2 Score 0 0 0 0 0  Altered sleeping 0 0 0 0 0  Tired, decreased energy 0 0 1 1 0  Change in appetite 0 0 0 0 0  Feeling bad or failure about yourself  0 0 0 0 0  Trouble concentrating 0 0 0 0 0  Moving slowly or fidgety/restless 0 0 0 0 0  Suicidal thoughts 0 0 0 0 0  PHQ-9 Score 0 0  1  1  0      Data saved with a previous flowsheet row definition     Current Outpatient Medications  Medication Instructions   acetaminophen  (TYLENOL ) 500 mg, Every 6 hours PRN   aspirin  EC 81 mg, Oral, Daily   famotidine  (PEPCID ) 20 mg, Oral, 2 times daily   ferrous sulfate  325 mg, Oral, Every other day   levothyroxine  (SYNTHROID ) 50 MCG tablet Take 2 tablets on Tuesday and Saturday.  1 Tablet all the other days of the week   Prenatal Vit-Fe Fumarate-FA (MULTIVITAMIN-PRENATAL) 27-0.8 MG TABS tablet 1 tablet, Daily   QVAR REDIHALER 80 MCG/ACT inhaler Inhale into the lungs.     Review of Systems:   Pertinent items are noted in HPI Denies abnormal vaginal discharge w/ itching/odor/irritation, headaches, visual changes, shortness of breath, chest pain, abdominal pain, severe nausea/vomiting, or problems with urination or  bowel movements unless otherwise stated above. Pertinent History Reviewed:  Reviewed past medical,surgical, social, obstetrical and family history.  Reviewed problem list, medications and allergies. Physical Assessment:   Vitals:   01/13/25 0936  BP: 139/80  Pulse: (!) 108  Weight: 152 lb 9.6 oz (69.2 kg)  Body mass index is 25.39 kg/m.           Physical Examination:   General appearance: alert, well appearing, and in no distress  Mental status: normal mood, behavior, speech, dress, motor activity, and thought processes  Skin: warm & dry   Extremities:   no edema or claf tenderness   Cardiovascular: normal heart rate noted  Respiratory: normal respiratory effort, no distress  Abdomen: gravid, soft, non-tender  Pelvic: Cervical exam deferred         Fetal Status: Fetal Heart Rate (bpm): 145 Fundal Height: 34 cm Movement: Present    Fetal Surveillance Testing today: doppler   Chaperone: N/A    No results found for this or any previous visit (from the past 24 hours).   Assessment & Plan:  High-risk pregnancy: H6E9888 at [redacted]w[redacted]d with an Estimated Date of Delivery: 02/19/25   1) Hypothyroidism - Stable with current medication   Meds: No orders of the defined types were placed in this encounter.  Labs/procedures today: none  Treatment Plan: Routine OB care, GBS next visit  Reviewed: Preterm labor symptoms and general obstetric precautions including but not limited to vaginal bleeding, contractions, leaking of fluid and fetal movement were reviewed in detail with the patient.  All questions were answered. Pt has home bp cuff. Check bp weekly, let us  know if >140/90.   Follow-up: Return in about 2 weeks (around 01/27/2025) for LROB visit.   Future Appointments  Date Time Provider Department Center  01/27/2025  9:50 AM Delores Nidia CROME, FNP [facility]  02/03/2025  9:50 AM Delores Nidia CROME, FNP [facility]  02/10/2025  9:50 AM Delores Nidia CROME, FNP [facility]   02/17/2025  9:10 AM Jayne Vonn DEL, MD [facility]    No orders of the defined types were placed in this encounter.   Jimy Gates, DO Attending Obstetrician & Gynecologist, [facility] for [facility]    "

## 2025-01-27 ENCOUNTER — Encounter: Admitting: Obstetrics and Gynecology

## 2025-02-03 ENCOUNTER — Encounter: Admitting: Obstetrics and Gynecology

## 2025-02-10 ENCOUNTER — Encounter: Admitting: Obstetrics and Gynecology

## 2025-02-17 ENCOUNTER — Encounter: Admitting: Obstetrics & Gynecology
